# Patient Record
Sex: Female | Born: 1985 | Race: White | Hispanic: No | Marital: Single | Smoking: Never smoker
Health system: Southern US, Community
[De-identification: ages and names within clinical notes are randomized; demographics above are authoritative.]

## PROBLEM LIST (undated history)

## (undated) DIAGNOSIS — D352 Benign neoplasm of pituitary gland: Secondary | ICD-10-CM

## (undated) DIAGNOSIS — G43909 Migraine, unspecified, not intractable, without status migrainosus: Secondary | ICD-10-CM

## (undated) HISTORY — DX: Benign neoplasm of pituitary gland: D35.2

---

## 2020-10-20 ENCOUNTER — Other Ambulatory Visit: Payer: Self-pay

## 2020-10-20 ENCOUNTER — Emergency Department (INDEPENDENT_AMBULATORY_CARE_PROVIDER_SITE_OTHER)
Admission: EM | Admit: 2020-10-20 | Discharge: 2020-10-20 | Disposition: A | Discharge status: Home / Self Care | Payer: BC Managed Care – PPO | Source: Home / Self Care | Attending: Family Medicine | Admitting: Family Medicine

## 2020-10-20 ENCOUNTER — Emergency Department (INDEPENDENT_AMBULATORY_CARE_PROVIDER_SITE_OTHER): Payer: BC Managed Care – PPO

## 2020-10-20 ENCOUNTER — Emergency Department: Admit: 2020-10-20 | Discharge status: Absent without leave | Payer: Self-pay

## 2020-10-20 DIAGNOSIS — J22 Unspecified acute lower respiratory infection: Secondary | ICD-10-CM | POA: Diagnosis not present

## 2020-10-20 DIAGNOSIS — R059 Cough, unspecified: Secondary | ICD-10-CM

## 2020-10-20 DIAGNOSIS — J069 Acute upper respiratory infection, unspecified: Secondary | ICD-10-CM

## 2020-10-20 DIAGNOSIS — R0989 Other specified symptoms and signs involving the circulatory and respiratory systems: Secondary | ICD-10-CM | POA: Diagnosis not present

## 2020-10-20 HISTORY — DX: Migraine, unspecified, not intractable, without status migrainosus: G43.909

## 2020-10-20 HISTORY — DX: Benign neoplasm of pituitary gland: D35.2

## 2020-10-20 LAB — POCT RAPID STREP A (OFFICE): Rapid Strep A Screen: NEGATIVE

## 2020-10-20 MED ORDER — AMOXICILLIN-POT CLAVULANATE 875-125 MG PO TABS: 1.0000 | ORAL_TABLET | Freq: Two times a day (BID) | ORAL | 0 refills | Status: DC

## 2020-10-20 MED ORDER — BENZONATATE 200 MG PO CAPS: 200.0000 mg | ORAL_CAPSULE | Freq: Two times a day (BID) | ORAL | 0 refills | Status: DC | PRN

## 2020-10-20 NOTE — ED Provider Notes (Signed)
[facility]    CSN: [id number] Arrival date & time: 10/20/20  0918      History   Chief Complaint Chief Complaint  Patient presents with  . Sore Throat  . Cough    HPI Suzanne Copeland is a 35 y.o. female.   HPI   Patient's been sick with a respiratory virus for 9 days.  She has had cough sore throat fever runny stuffy nose headache fatigue sweats chills and fever since that time.  She is vaccinated against COVID.  She had a home COVID test that was negative.  She has not been vaccinated against influenza.  Unknown exposure.  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  Rapid strep done today and is negative.  Patient has significant laryngitis.  She states it hurts when she coughs in her chest.  She is coughing up green phlegm and mucus.  No underlying lung disease.  She is never been a smoker.  She has some shortness of breath but no sensation of wheezing.  Past Medical History:  Diagnosis Date  . Migraine   . 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([place])     There are no problems to display for this patient.   Past Surgical History:  Procedure Laterality Date  . CESAREAN SECTION      OB History   No obstetric history on file.      Home Medications    Prior to Admission medications   Medication Sig Start Date End Date Taking? Authorizing Provider  amoxicillin-clavulanate (AUGMENTIN) 875-125 MG tablet Take 1 tablet by mouth every 12 (twelve) hours. 10/20/20  Yes Raylene Everts, MD  benzonatate (TESSALON) 200 MG capsule Take 1 capsule (200 mg total) by mouth 2 (two) times daily as needed for cough. 10/20/20  Yes Raylene Everts, MD  cyclobenzaprine (FLEXERIL) 10 MG tablet Take by mouth. 08/17/20  Yes [provider]  guaiFENesin (ROBITUSSIN) 100 MG/5ML SOLN Take 5 mLs by mouth every 4 (four) hours as needed for cough or to loosen phlegm.   Yes [provider]  pantoprazole (PROTONIX) 40 MG tablet Take by mouth.  07/14/20 07/09/21 Yes [provider]  topiramate (TOPAMAX) 50 MG tablet TAKE 1 TABLET EVERY MORNING AND 2 TABLETS EVERY EVENING FOR 7 DAYS THEN TAKE 2 TABLETS TWICE A DAY 07/07/20  Yes [provider]  traZODone (DESYREL) 50 MG tablet 1 tab p.o. nightly 05/16/20  Yes [provider]    Family History Family History  Problem Relation Age of Onset  . Stroke Mother   . Hypertension Mother   . Diabetes Mother     Social History Social History   Tobacco Use  . Smoking status: Never Smoker  . Smokeless tobacco: Never Used  Vaping Use  . Vaping Use: Never used  Substance Use Topics  . Alcohol use: Not Currently  . Drug use: Not Currently     Allergies   Ciprofloxacin   Review of Systems Review of Systems  See HPI Physical Exam Triage Vital Signs ED Triage Vitals  Enc Vitals Group     BP 10/20/20 0947 102/70     Pulse Rate 10/20/20 0947 (!) 109     Resp 10/20/20 0947 18     Temp 10/20/20 0947 98.2 F (36.8 C)     Temp Source 10/20/20 0947 Oral     SpO2 10/20/20 0947 98 %     Weight 10/20/20 0939 132 lb (59.9 kg)  Height 10/20/20 0939 5\' 1"  (1.549 m)     Head Circumference --      Peak Flow --      Pain Score 10/20/20 0939 10     Pain Loc --      Pain Edu? --      Excl. in [place]? --    No data found.  Updated Vital Signs BP 102/70 (BP Location: Right Arm)   Pulse (!) 109   Temp 98.2 F (36.8 C) (Oral)   Resp 18   Ht 5\' 1"  (1.549 m)   Wt 59.9 kg   LMP 10/13/2020   SpO2 98%   BMI 24.94 kg/m      Physical Exam Constitutional:      General: She is not in acute distress.    Appearance: She is well-developed. She is ill-appearing.     Comments: Appears tired.  Hoarse voice.  HENT:     Head: Normocephalic and atraumatic.     Right Ear: Tympanic membrane and ear canal normal.     Left Ear: Tympanic membrane and ear canal normal.     Nose: Congestion present.     Mouth/Throat:     Mouth: Mucous membranes are moist.     Pharynx:  Posterior oropharyngeal erythema present.  Eyes:     Conjunctiva/sclera: Conjunctivae normal.     Pupils: Pupils are equal, round, and reactive to light.  Cardiovascular:     Rate and Rhythm: Normal rate and regular rhythm.     Heart sounds: Normal heart sounds.  Pulmonary:     Effort: Pulmonary effort is normal. No respiratory distress.     Breath sounds: Normal breath sounds.  Abdominal:     General: There is no distension.     Palpations: Abdomen is soft.  Musculoskeletal:        General: Normal range of motion.     Cervical back: Normal range of motion.  Lymphadenopathy:     Cervical: Cervical adenopathy present.  Skin:    General: Skin is warm and dry.  Neurological:     Mental Status: She is alert.  Psychiatric:        Behavior: Behavior normal.      UC Treatments / Results  Labs (all labs ordered are listed, but only abnormal results are displayed) Labs Reviewed  CULTURE, GROUP A STREP  COVID-19, FLU A+B NAA  COVID-19, FLU A+B AND RSV  POCT RAPID STREP A (OFFICE)  Rapid strep is negative.  Will send culture  EKG   Radiology DG Chest 2 View  Result Date: 10/20/2020 CLINICAL DATA:  Cough and congestion for 9 days EXAM: CHEST - 2 VIEW COMPARISON:  None. FINDINGS: The heart size and mediastinal contours are within normal limits. Both lungs are clear. The visualized skeletal structures are unremarkable. IMPRESSION: No active cardiopulmonary disease. Electronically Signed   By: Monte Fantasia M.D.   On: 10/20/2020 10:58    Procedures Procedures (including critical care time)  Medications Ordered in UC Medications - No data to display  Initial Impression / Assessment and Plan / UC Course  I have reviewed the triage vital signs and the nursing notes.  Pertinent labs & imaging results that were available during my care of the patient were reviewed by me and considered in my medical decision making (see chart for details).     Strep testing negative.  Viral  testing pending.  Likely an upper respiratory virus with persistent symptoms.  After 9 days patient is still significantly  fatigued, feeling ill, and coughing up green sputum.  Will cover with antibiotics.  Continue Tessalon.  Push fluids.  See PCP if not improving in a few days Final Clinical Impressions(s) / UC Diagnoses   Final diagnoses:  Viral upper respiratory tract infection with cough  LRTI (lower respiratory tract infection)     Discharge Instructions     Rest and drink lots of fluids Antibiotic 2 times a day Take a probiotic while you are on antibiotic to prevent stomach upset Take Tessalon 200 mg twice a day May take in addition, if needed, Mucinex DM twice a day Expect improvement over next few days     ED Prescriptions    Medication Sig Dispense Auth. Provider   amoxicillin-clavulanate (AUGMENTIN) 875-125 MG tablet Take 1 tablet by mouth every 12 (twelve) hours. 14 tablet Raylene Everts, MD   benzonatate (TESSALON) 200 MG capsule Take 1 capsule (200 mg total) by mouth 2 (two) times daily as needed for cough. 20 capsule Raylene Everts, MD     PDMP not reviewed this encounter.   Raylene Everts, MD 10/20/20 [phone]

## 2020-10-20 NOTE — Discharge Instructions (Addendum)
Rest and drink lots of fluids Antibiotic 2 times a day Take a probiotic while you are on antibiotic to prevent stomach upset Take Tessalon 200 mg twice a day May take in addition, if needed, Mucinex DM twice a day Expect improvement over next few days

## 2020-10-20 NOTE — ED Triage Notes (Addendum)
Pt presents to Urgent Care with c/o fever, sore throat, cough, and congestion x 9 days. Cannot speak above a whisper. Reports a negative home COVID test yesterday. Has been vaccinated against COVID, not flu.

## 2020-10-21 LAB — CULTURE, GROUP A STREP

## 2020-10-22 LAB — COVID-19, FLU A+B AND RSV
Influenza A, NAA: NOT DETECTED
Influenza B, NAA: NOT DETECTED
RSV, NAA: NOT DETECTED
SARS-CoV-2, NAA: NOT DETECTED

## 2021-04-20 HISTORY — PX: COLONOSCOPY: SHX174

## 2021-04-20 HISTORY — PX: UPPER GI ENDOSCOPY: SHX6162

## 2021-04-21 ENCOUNTER — Ambulatory Visit
Admission: RE | Admit: 2021-04-21 | Discharge: 2021-04-21 | Disposition: A | Discharge status: Home / Self Care | Payer: Managed Care, Other (non HMO) | Source: Ambulatory Visit | Attending: Nurse Practitioner | Admitting: Nurse Practitioner

## 2021-04-21 ENCOUNTER — Other Ambulatory Visit: Payer: Self-pay

## 2021-04-21 ENCOUNTER — Other Ambulatory Visit: Payer: Self-pay | Admitting: Nurse Practitioner

## 2021-04-21 DIAGNOSIS — R1084 Generalized abdominal pain: Secondary | ICD-10-CM | POA: Insufficient documentation

## 2021-04-21 MED ORDER — IOHEXOL 300 MG/ML  SOLN
100.0000 mL | Freq: Once | INTRAMUSCULAR | Status: AC | PRN
  Administered 2021-04-21: 100 mL via INTRAVENOUS

## 2021-04-28 ENCOUNTER — Other Ambulatory Visit: Payer: Self-pay

## 2021-04-28 ENCOUNTER — Ambulatory Visit (INDEPENDENT_AMBULATORY_CARE_PROVIDER_SITE_OTHER): Payer: Managed Care, Other (non HMO) | Admitting: Urology

## 2021-04-28 ENCOUNTER — Encounter: Payer: Self-pay | Admitting: Urology

## 2021-04-28 VITALS — BP 103/70 | HR 101 | Ht 61.0 in | Wt 126.0 lb

## 2021-04-28 DIAGNOSIS — R109 Unspecified abdominal pain: Secondary | ICD-10-CM | POA: Diagnosis not present

## 2021-04-28 DIAGNOSIS — N3281 Overactive bladder: Secondary | ICD-10-CM

## 2021-04-28 NOTE — Progress Notes (Signed)
   04/28/21 12:55 PM   Luetta Nutting Olen Pel [birth year] [phone]  CC: Abdominal pain, left renal cysts, bladder symptoms  HPI: I saw Ms. Suzanne Copeland today for evaluation of the above issues.  She is a 35 year old female with a history of at least a few years of chronic midline mid and upper abdominal pain.  This seems to be worse with eating, and associated with meals.  She also has significant constipation with a bowel movement only 1-2 times per week.  She is undergoing work-up with GI.  She recently had a CT that showed possible left mild hydronephrosis versus parapelvic cyst.  She had multiple imaging tests with renal ultrasound x2 and CT with contrast in [place] within the last 2 years that confirmed these are parapelvic cysts and not hydronephrosis.  There are no stones or hydroureter.  She denies any Dietl's crisis or left-sided pain with fluid intake.  Renal function is normal, and urinalysis was benign.  She also reports bladder symptoms of urgency and decreased sensation to urinate.  She drinks primarily Dr. Malachi Bonds during the day.  She had an episode of incontinence where she did not make it in time.  She does not like to take any medications unless absolutely necessary.  She denies any gross hematuria or dysuria.     PMH: Past Medical History:  Diagnosis Date   Migraine    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([place])     Surgical History: Past Surgical History:  Procedure Laterality Date   CESAREAN SECTION        Family History: Family History  Problem Relation Age of Onset   Stroke Mother    Hypertension Mother    Diabetes Mother     Social History:  reports that she has never smoked. She has never used smokeless tobacco. She reports that she does not currently use alcohol. She reports that she does not currently use drugs.  Physical Exam: BP 103/70   Pulse (!) 101   Ht 5\' 1"  (1.549 m)   Wt 126 lb (57.2 kg)   BMI 23.81 kg/m    Constitutional:  Alert and oriented, No acute  distress. Cardiovascular: No clubbing, cyanosis, or edema. Respiratory: Normal respiratory effort, no increased work of breathing. GI: Abdomen is soft, nontender, nondistended, no abdominal masses   Laboratory Data: Reviewed, see HPI  Pertinent Imaging: I have personally viewed and interpreted the recent CT showing multiple left parapelvic cyst but no definitive hydronephrosis based on comparison to prior imaging.  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  I did not recommend any further evaluation or investigation regarding her left parapelvic cyst, as he is been stable since 2020, and she does not have any symptoms clinically that correlate with an underlying UPJ obstruction that would warrant further imaging with a CT urogram or renal scan.  We reviewed behavioral strategies including timed voiding and avoiding bladder irritants like soda regarding her urinary urgency and overactive symptoms.  She would not be a good candidate for anticholinergics with her baseline constipation.  Follow-up with urology as needed  Nickolas Madrid, MD 04/28/2021  [facility] [address], [place],  [address] [phone]

## 2021-04-28 NOTE — Patient Instructions (Signed)

## 2021-04-30 ENCOUNTER — Other Ambulatory Visit: Payer: Self-pay | Admitting: Nurse Practitioner

## 2021-04-30 DIAGNOSIS — Z8719 Personal history of other diseases of the digestive system: Secondary | ICD-10-CM

## 2021-04-30 DIAGNOSIS — K5909 Other constipation: Secondary | ICD-10-CM

## 2021-05-05 ENCOUNTER — Other Ambulatory Visit: Payer: Self-pay

## 2021-05-05 ENCOUNTER — Ambulatory Visit
Admission: RE | Admit: 2021-05-05 | Discharge: 2021-05-05 | Disposition: A | Discharge status: Home / Self Care | Payer: Managed Care, Other (non HMO) | Source: Ambulatory Visit | Attending: Nurse Practitioner | Admitting: Nurse Practitioner

## 2021-05-05 DIAGNOSIS — K5909 Other constipation: Secondary | ICD-10-CM | POA: Insufficient documentation

## 2021-05-05 DIAGNOSIS — Z8719 Personal history of other diseases of the digestive system: Secondary | ICD-10-CM | POA: Diagnosis present

## 2021-05-07 ENCOUNTER — Other Ambulatory Visit: Payer: Self-pay | Admitting: Nurse Practitioner

## 2021-05-07 DIAGNOSIS — K824 Cholesterolosis of gallbladder: Secondary | ICD-10-CM

## 2021-05-10 ENCOUNTER — Other Ambulatory Visit: Payer: Self-pay | Admitting: Internal Medicine

## 2021-05-12 ENCOUNTER — Ambulatory Visit: Payer: Managed Care, Other (non HMO) | Admitting: Urology

## 2021-05-12 LAB — SURGICAL PATHOLOGY

## 2021-05-21 ENCOUNTER — Other Ambulatory Visit: Payer: Self-pay | Admitting: General Surgery

## 2021-05-21 NOTE — Progress Notes (Signed)
Subjective:     Patient ID: Suzanne Copeland is a 35 y.o. female.   HPI   The following portions of the patient's history were reviewed and updated as appropriate.   This a new patient is here today for: office visit. Here for evaluation of gall bladder polyp referred by Dawson Bills NP.     She does admit to chronic right upper quadrant abdominal pain. Patient rates the pain as a 9 in the upper right abdomin and the pain travels to her shoulders. Patients reports she feels nausea most of the time.  She had initially been placed on Protonix and then double dose without significant improvement.   The patient reports that she was at her baseline until 2019 when she was placed on amitriptyline to help manage headaches.  Shortly after that she began to have profound weight gain up to 185 pounds.  When the amitriptyline was stopped in 2020 and she lost weight back to her baseline she began to have abdominal pain.  In 2020 while living in [place] she reports an evaluation by CT describing a 2 mm gallbladder nodule.   The patient describes the right upper quadrant pain after meals as a gnawing, twisting pain that may last for hours.  It does seem to radiate up into the anterior shoulder.   Protonix was initially prescribed in 2020 and then she was placed on a twice daily schedule without significant improvement.  She reports that she has irregular bowel movements, but she does take Miralax to help her bowel movements.    Patient reports she feels bloated and has nausea after each meal.  She has tried to modify her diet, and now is limited to a pescatarian diet even then with postprandial abdominal pain.   Her symptoms have been since 2020 while in [place], she did have a CT scan there as well.   The patient was born in [place], moved to [place] at age 53, back to [place] at age 13 and now in [place] for about 14 months.   She reports a significant family history of gallbladder  disease with multiple family members having "polyps."  She reports her paternal grandfather died of gallbladder complications in 8182.     Review of Systems  Constitutional: Negative for chills and fever.  Respiratory: Negative for cough.          Chief Complaint  Patient presents with   New Patient      BP 104/76   Pulse 93   Temp 36.4 C (97.6 F)   Ht 154.9 cm ('5\' 1"' )   Wt 57.4 kg (126 lb 9.6 oz)   LMP 05/02/2021   SpO2 100%   BMI 23.92 kg/m        Past Medical History:  Diagnosis Date   Chronic abdominal pain, unspecified     Chronic gastritis     Constipation     Migraine headache     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([facility])             Past Surgical History:  Procedure Laterality Date   CESAREAN SECTION N/A     CESAREAN SECTION N/A     exploratory gyn surgery N/A     LAPAROSCOPIC TUBAL LIGATION                    OB History     Gravida  3   Para  2   Term  Preterm      AB      Living         SAB      IAB      Ectopic      Molar      Multiple      Live Births           Obstetric Comments  Age at first period 35 Age of first pregnancy 2             Social History        Socioeconomic History   Marital status: Single  Tobacco Use   Smoking status: Never   Smokeless tobacco: Never  Substance and Sexual Activity   Alcohol use: Yes   Drug use: Never          Allergies  Allergen Reactions   Ciprofloxacin Rash and Hives            Current Outpatient Medications  Medication Sig Dispense Refill   cyclobenzaprine (FLEXERIL) 10 MG tablet Take 1 tablet (10 mg total) by mouth 2 (two) times daily 60 tablet 2   linaCLOtide (LINZESS) 145 mcg capsule Take 1 capsule (145 mcg total) by mouth once daily for 30 days Take 30 mins before food 30 capsule 0   pantoprazole (PROTONIX) 40 MG DR tablet Take 1 tablet (40 mg total) by mouth 2 (two) times daily before meals 60 tablet 6   topiramate (TOPAMAX) 100 MG tablet Take 1 tablet (100 mg total)  by mouth 2 (two) times daily 60 tablet 2   traZODone (DESYREL) 50 MG tablet Take 1 tablet (50 mg total) by mouth at bedtime 30 tablet 2   ubrogepant 50 mg Tab Take 60m at headache onset. Can repeat after 2 hours if needed. Do not exceed 2037min 24 hours. 20 tablet 1   eptinezumab-jjmr (VYEPTI) 100 mg/mL intravenous solution Inject 1 mL (100 mg total) into the vein every 3 (three) months (Patient not taking: Reported on 04/29/2021) 1 mL 3   ubrogepant (UBRELVY ORAL) Take by mouth       venlafaxine (EFFEXOR) 37.5 MG tablet Start taking Effexor 37.5 mg daily for one week, then increase to 37.5 mg twice per day for headaches. (Patient not taking: Reported on 05/20/2021) 60 tablet 3    No current facility-administered medications for this visit.           Family History  Problem Relation Age of Onset   Diabetes Mother     High blood pressure (Hypertension) Mother     Stroke Mother     Breast cancer Maternal Aunt     Gallbladder disease Maternal Aunt     Breast cancer Maternal Grandmother     Breast cancer Paternal Grandmother     Colon polyps Paternal Grandfather     Colon cancer Neg Hx          Labs and Radiology:    Upper and lower endoscopy studies of May 10, 2021 were reviewed and discussed with the treating physician, KeMadolyn FriezeD.  Mild chronic gastritis.  No evidence of H. pylori.  No evidence of colitis or adenomatous polyps.   CT of the abdomen and pelvis of April 21, 2021:   IMPRESSION: 1.  Prominent stool throughout the colon favors constipation. 2. Small fat density along the posterior wall of the gallbladder could be a small gallstone or a small benign polyp. 3. Mild fluid density prominence along the left collecting system, possibly from parapelvic cysts,  although I cannot exclude mild caliectasis. No stone is identified and there is no hydroureter.   Abdominal ultrasound dated May 05, 2021:   IMPRESSION: Multiple gallbladder polyps measuring up to 6  mm. Given their size, follow-up sonography is recommended in 6-12 months to document stability.   These imaging studies were independently reviewed.   Laboratory review March 17, 2021: Component Ref Range & Units 2 mo ago   White Blood Cell Count - [facility] 3.4 - 10.8 x10E3/uL 4.9   Red Blood Cell Count - [facility] 3.77 - 5.28 x10E6/uL 4.26   Hemoglobin - [facility] 11.1 - 15.9 g/dL 11.8   Hematocrit - [facility] 34.0 - 46.6 % 36.7   MCV - [facility] 79 - 97 fL 86   MCH  - [facility] 26.6 - 33.0 pg 27.7   MCHC - [facility] 31.5 - 35.7 g/dL 32.2   RDW - [facility] 11.7 - 15.4 % 12.5   Platelet Count - [facility] 150 - 450 x10E3/uL 335   Neutrophils - [facility] Not Estab. % 54   LYMPHS -  [facility] Not Estab. % 34   Monocytes - [facility] Not Estab. % 9   Eos - [facility] Not Estab. % 2   Basos - [facility] Not Estab. % 1   Neutrophils (Absolute) - [facility] 1.4 - 7.0 x10E3/uL 2.6   Lymphs (Absolute) - [facility] 0.7 - 3.1 x10E3/uL 1.7   Monocytes(Absolute) - [facility] 0.1 - 0.9 x10E3/uL 0.4   Eos (Absolute) - [facility] 0.0 - 0.4 x10E3/uL 0.1   Baso (Absolute) - [facility] 0.0 - 0.2 x10E3/uL 0.1   Immature Granulocytes - [facility] Not Estab. % 0   Immature Grans (Abs) - [facility] 0.0 - 0.1 x10E3/uL 0.0       Glucose Random - [facility] 70 - 99 mg/dL 84   Comment:               **Please note reference interval change**  Blood Urea Nitrogen - [facility] 6 - 20 mg/dL 17   Creatinine  - [facility] 0.57 - 1.00 mg/dL 0.85   EGFR (CKD-EPI 2021) - [facility] >59 mL/min/1.73 90   Bun/Creatinine Ratio - [facility] 9 - 23 20   Sodium - [facility] 134 - 144 mmol/L 146 High    Potassium - [facility] 3.5 - 5.2 mmol/L 4.6   Chloride - [facility] 96 - 106 mmol/L 111 High    Carbon Dioxide - [facility] 20 - 29 mmol/L 19 Low    Calcium - [facility] 8.7 - 10.2 mg/dL 9.1   Protein Total - [facility] 6.0 - 8.5 g/dL 6.9   Albumin - [facility] 3.8 - 4.8 g/dL 4.3   Globulin, Total - [facility] 1.5 - 4.5 g/dL 2.6   A/G Ratio - [facility] 1.2 - 2.2 1.7   Bilirubin Total - [facility] 0.0 -  1.2 mg/dL <0.2   Alkaline Phosphatase - [facility] 44 - 121 IU/L 66   Ast - [facility] 0 - 40 IU/L 17   Alanine Aminotransferase - [facility] 0 - 32 IU/L 11     Hemoglobin A1c - [facility] 4.8 - 5.6 % 5.4            Objective:   Physical Exam Exam conducted with a chaperone present.  Constitutional:      Appearance: Normal appearance.  Cardiovascular:     Rate and Rhythm: Normal rate and regular rhythm.     Pulses: Normal pulses.     Heart sounds: Normal heart sounds.  Pulmonary:     Effort: Pulmonary effort is normal.  Breath sounds: Normal breath sounds.  Abdominal:     General: Bowel sounds are normal.     Palpations: Abdomen is soft.     Tenderness: There is abdominal tenderness in the right upper quadrant.     Hernia: No hernia is present.  Musculoskeletal:     Cervical back: Neck supple.  Skin:    General: Skin is warm and dry.  Neurological:     Mental Status: She is alert and oriented to person, place, and time.  Psychiatric:        Mood and Affect: Mood normal.        Behavior: Behavior normal.           Assessment:     Multiple gallbladder polyps greater than 5 mm.   Postprandial abdominal pain with negative EGD/colonoscopy.   Weight loss   Plan:     The pros and cons of elective cholecystectomy for her present findings were reviewed in detail.  No guarantee of relief of symptoms can be made.   The case was reviewed with Dr. Alice Reichert from GI.   As no other source for her pain is identified, and by report the polyps have enlarged over the last 2 years (2020 CT not available for review) it seems reasonable to offer cholecystectomy.   Detailed review of the risks associated with the procedure including anesthesia, injury to adjacent organs and biliary tract injury were reviewed.  Possibility of an open procedure was discussed.   At this time she is looking at any option for her pain management.  Again, additional time was spent with the patient to confirm that  she is aware that I guarantee cannot be made regarding relief of symptoms.      This note is partially prepared by Karie Fetch, RN, acting as a scribe in the presence of Dr. Hervey Ard, MD.  The documentation recorded by the scribe accurately reflects the service I personally performed and the decisions made by me.    Robert Bellow, MD FACS

## 2021-05-26 ENCOUNTER — Other Ambulatory Visit
Admission: RE | Admit: 2021-05-26 | Discharge: 2021-05-26 | Disposition: A | Discharge status: Home / Self Care | Payer: Managed Care, Other (non HMO) | Source: Ambulatory Visit | Attending: General Surgery | Admitting: General Surgery

## 2021-05-26 ENCOUNTER — Other Ambulatory Visit: Payer: Self-pay

## 2021-05-26 NOTE — Patient Instructions (Signed)
Your procedure is scheduled on: Friday May 28, 2021. Report to Day Surgery inside [facility] 2nd floor, stop by admission desk before getting on elevator. To find out your arrival time please call [phone] between 1PM - 3PM on Thursday May 27, 2021.  Remember: Instructions that are not followed completely may result in serious medical risk,  up to and including death, or upon the discretion of your surgeon and anesthesiologist your  surgery may need to be rescheduled.     _X__ 1. Do not eat food after midnight the night before your procedure.                 No chewing gum or hard candies. You may drink clear liquids up to 2 hours                 before you are scheduled to arrive for your surgery- DO not drink clear                 liquids within 2 hours of the start of your surgery.                 Clear Liquids include:  water, apple juice without pulp, clear Gatorade, G2 or                  Gatorade Zero (avoid Red/Purple/Blue), Black Coffee or Tea (Do not add                 anything to coffee or tea).  __X__2.  On the morning of surgery brush your teeth with toothpaste and water, you                may rinse your mouth with mouthwash if you wish.  Do not swallow any toothpaste or mouthwash.     _X__ 3.  No Alcohol for 24 hours before or after surgery.   _X__ 4.  Do Not Smoke or use e-cigarettes For 24 Hours Prior to Your Surgery.                 Do not use any chewable tobacco products for at least 6 hours prior to                 Surgery.  _X__  5.  Do not use any recreational drugs (marijuana, cocaine, heroin, ecstasy, MDMA or other)                For at least one week prior to your surgery.  Combination of these drugs with anesthesia                May have life threatening results.  __X__ 6.  Notify your doctor if there is any change in your medical condition      (cold, fever, infections).     Do not wear jewelry, make-up, hairpins,  clips or nail polish. Do not wear lotions, powders, or perfumes. You may wear deodorant. Do not shave 48 hours prior to surgery. Men may shave face and neck. Do not bring valuables to the hospital.    [facility] is not responsible for any belongings or valuables.  Contacts, dentures or bridgework may not be worn into surgery. Leave your suitcase in the car. After surgery it may be brought to your room. For patients admitted to the hospital, discharge time is determined by your treatment team.   Patients discharged the day of surgery will not be allowed to drive home.  Make arrangements for someone to be with you for the first 24 hours of your Same Day Discharge.   __X__ Take these medicines the morning of surgery with A SIP OF WATER:    1. topiramate (TOPAMAX) 100 MG  2.   3.   4.  5.  6.  ____ Fleet Enema (as directed)   __X__ Use Antibacterial Soap (or wipes) as directed  ____ Use Benzoyl Peroxide Gel as instructed  ____ Use inhalers on the day of surgery  ____ Stop metformin 2 days prior to surgery    ____ Take 1/2 of usual insulin dose the night before surgery. No insulin the morning          of surgery.   ____ Call your PCP, cardiologist, or Pulmonologist if taking Coumadin/Plavix/aspirin and ask when to stop before your surgery.   __X__ One Week prior to surgery- Stop Anti-inflammatories such as Ibuprofen, Aleve, Advil, Motrin, meloxicam (MOBIC), diclofenac, etodolac, ketorolac, Toradol, Daypro, piroxicam, Goody's or BC powders. OK TO USE TYLENOL IF NEEDED   __X__ Stop supplements until after surgery.    ____ Bring C-Pap to the hospital.    If you have any questions regarding your pre-procedure instructions,  Please call Pre-admit Testing at [phone].

## 2021-05-27 MED ORDER — ORAL CARE MOUTH RINSE: 15.0000 mL | Freq: Once | OROMUCOSAL | Status: AC

## 2021-05-27 MED ORDER — CHLORHEXIDINE GLUCONATE 0.12 % MT SOLN: 15.0000 mL | Freq: Once | OROMUCOSAL | Status: AC

## 2021-05-27 MED ORDER — FAMOTIDINE 20 MG PO TABS: 20.0000 mg | ORAL_TABLET | Freq: Once | ORAL | Status: AC

## 2021-05-27 MED ORDER — CHLORHEXIDINE GLUCONATE CLOTH 2 % EX PADS: 6.0000 | MEDICATED_PAD | Freq: Once | CUTANEOUS | Status: DC

## 2021-05-27 MED ORDER — LACTATED RINGERS IV SOLN: INTRAVENOUS | Status: DC

## 2021-05-28 ENCOUNTER — Encounter
Admission: RE | Disposition: A | Discharge status: Home / Self Care | Payer: Self-pay | Source: Home / Self Care | Attending: General Surgery

## 2021-05-28 ENCOUNTER — Other Ambulatory Visit: Payer: Self-pay

## 2021-05-28 ENCOUNTER — Ambulatory Visit: Payer: Managed Care, Other (non HMO) | Admitting: Certified Registered Nurse Anesthetist

## 2021-05-28 ENCOUNTER — Ambulatory Visit: Payer: Managed Care, Other (non HMO)

## 2021-05-28 ENCOUNTER — Encounter: Payer: Self-pay | Admitting: General Surgery

## 2021-05-28 ENCOUNTER — Ambulatory Visit
Admission: RE | Admit: 2021-05-28 | Discharge: 2021-05-28 | Disposition: A | Discharge status: Home / Self Care | Payer: Managed Care, Other (non HMO) | Attending: General Surgery | Admitting: General Surgery

## 2021-05-28 DIAGNOSIS — K811 Chronic cholecystitis: Secondary | ICD-10-CM | POA: Diagnosis not present

## 2021-05-28 DIAGNOSIS — Z419 Encounter for procedure for purposes other than remedying health state, unspecified: Secondary | ICD-10-CM

## 2021-05-28 DIAGNOSIS — K824 Cholesterolosis of gallbladder: Secondary | ICD-10-CM | POA: Diagnosis present

## 2021-05-28 HISTORY — PX: CHOLECYSTECTOMY: SHX55

## 2021-05-28 LAB — POCT PREGNANCY, URINE: Preg Test, Ur: NEGATIVE

## 2021-05-28 SURGERY — LAPAROSCOPIC CHOLECYSTECTOMY WITH INTRAOPERATIVE CHOLANGIOGRAM
Anesthesia: General | Site: Abdomen

## 2021-05-28 MED ORDER — ONDANSETRON HCL 4 MG/2ML IJ SOLN
INTRAMUSCULAR | Status: AC
  Filled 2021-05-28: qty 2

## 2021-05-28 MED ORDER — OXYCODONE HCL 5 MG PO TABS: 5.0000 mg | ORAL_TABLET | ORAL | Status: DC | PRN

## 2021-05-28 MED ORDER — LIDOCAINE HCL (PF) 2 % IJ SOLN
INTRAMUSCULAR | Status: AC
  Filled 2021-05-28: qty 5

## 2021-05-28 MED ORDER — BUPIVACAINE-EPINEPHRINE (PF) 0.5% -1:200000 IJ SOLN
INTRAMUSCULAR | Status: AC
  Filled 2021-05-28: qty 30

## 2021-05-28 MED ORDER — FENTANYL CITRATE (PF) 100 MCG/2ML IJ SOLN
25.0000 ug | INTRAMUSCULAR | Status: DC | PRN
  Administered 2021-05-28: 25 ug via INTRAVENOUS
  Administered 2021-05-28: 50 ug via INTRAVENOUS

## 2021-05-28 MED ORDER — SUGAMMADEX SODIUM 200 MG/2ML IV SOLN
INTRAVENOUS | Status: DC | PRN
  Administered 2021-05-28: 114.4 mg via INTRAVENOUS

## 2021-05-28 MED ORDER — PROPOFOL 10 MG/ML IV BOLUS
INTRAVENOUS | Status: DC | PRN
  Administered 2021-05-28: 130 mg via INTRAVENOUS

## 2021-05-28 MED ORDER — HYDROCODONE-ACETAMINOPHEN 5-325 MG PO TABS
ORAL_TABLET | ORAL | Status: AC
  Filled 2021-05-28: qty 1

## 2021-05-28 MED ORDER — HEMOSTATIC AGENTS (NO CHARGE) OPTIME
TOPICAL | Status: DC | PRN
  Administered 2021-05-28: 1 via TOPICAL

## 2021-05-28 MED ORDER — FENTANYL CITRATE (PF) 100 MCG/2ML IJ SOLN
INTRAMUSCULAR | Status: DC | PRN
  Administered 2021-05-28 (×2): 50 ug via INTRAVENOUS

## 2021-05-28 MED ORDER — OXYCODONE HCL 5 MG PO TABS
ORAL_TABLET | ORAL | Status: AC
  Administered 2021-05-28: 5 mg via ORAL
  Filled 2021-05-28: qty 1

## 2021-05-28 MED ORDER — HYDROCODONE-ACETAMINOPHEN 5-325 MG PO TABS: 1.0000 | ORAL_TABLET | ORAL | Status: DC | PRN

## 2021-05-28 MED ORDER — FENTANYL CITRATE (PF) 100 MCG/2ML IJ SOLN
INTRAMUSCULAR | Status: AC
  Administered 2021-05-28: 25 ug via INTRAVENOUS
  Filled 2021-05-28: qty 2

## 2021-05-28 MED ORDER — KETOROLAC TROMETHAMINE 30 MG/ML IJ SOLN
INTRAMUSCULAR | Status: AC
  Filled 2021-05-28: qty 1

## 2021-05-28 MED ORDER — SODIUM CHLORIDE 0.9 % IV SOLN
INTRAVENOUS | Status: DC | PRN
  Administered 2021-05-28: 20 mL

## 2021-05-28 MED ORDER — HYDROCODONE-ACETAMINOPHEN 5-325 MG PO TABS: 1.0000 | ORAL_TABLET | ORAL | 0 refills | Status: DC | PRN

## 2021-05-28 MED ORDER — ONDANSETRON HCL 4 MG/2ML IJ SOLN
INTRAMUSCULAR | Status: DC | PRN
  Administered 2021-05-28: 4 mg via INTRAVENOUS

## 2021-05-28 MED ORDER — DEXMEDETOMIDINE (PRECEDEX) IN NS 20 MCG/5ML (4 MCG/ML) IV SYRINGE
PREFILLED_SYRINGE | INTRAVENOUS | Status: DC | PRN
  Administered 2021-05-28: 8 ug via INTRAVENOUS
  Administered 2021-05-28: 12 ug via INTRAVENOUS
  Administered 2021-05-28: 8 ug via INTRAVENOUS
  Administered 2021-05-28: 4 ug via INTRAVENOUS

## 2021-05-28 MED ORDER — PROMETHAZINE HCL 25 MG/ML IJ SOLN
INTRAMUSCULAR | Status: AC
  Administered 2021-05-28: 6.25 mg via INTRAVENOUS
  Filled 2021-05-28: qty 1

## 2021-05-28 MED ORDER — MIDAZOLAM HCL 2 MG/2ML IJ SOLN
INTRAMUSCULAR | Status: DC | PRN
  Administered 2021-05-28: 2 mg via INTRAVENOUS

## 2021-05-28 MED ORDER — CHLORHEXIDINE GLUCONATE 0.12 % MT SOLN
OROMUCOSAL | Status: AC
  Administered 2021-05-28: 15 mL via OROMUCOSAL
  Filled 2021-05-28: qty 15

## 2021-05-28 MED ORDER — HYDROMORPHONE HCL 1 MG/ML IJ SOLN
INTRAMUSCULAR | Status: AC
  Filled 2021-05-28: qty 1

## 2021-05-28 MED ORDER — FAMOTIDINE 20 MG PO TABS
ORAL_TABLET | ORAL | Status: AC
  Administered 2021-05-28: 20 mg via ORAL
  Filled 2021-05-28: qty 1

## 2021-05-28 MED ORDER — LIDOCAINE HCL (CARDIAC) PF 100 MG/5ML IV SOSY
PREFILLED_SYRINGE | INTRAVENOUS | Status: DC | PRN
  Administered 2021-05-28: 80 mg via INTRAVENOUS

## 2021-05-28 MED ORDER — ACETAMINOPHEN 10 MG/ML IV SOLN
INTRAVENOUS | Status: AC
  Filled 2021-05-28: qty 100

## 2021-05-28 MED ORDER — 0.9 % SODIUM CHLORIDE (POUR BTL) OPTIME
TOPICAL | Status: DC | PRN
  Administered 2021-05-28: 500 mL

## 2021-05-28 MED ORDER — PROPOFOL 10 MG/ML IV BOLUS
INTRAVENOUS | Status: AC
  Filled 2021-05-28: qty 20

## 2021-05-28 MED ORDER — DEXAMETHASONE SODIUM PHOSPHATE 10 MG/ML IJ SOLN
INTRAMUSCULAR | Status: AC
  Filled 2021-05-28: qty 1

## 2021-05-28 MED ORDER — MIDAZOLAM HCL 2 MG/2ML IJ SOLN
INTRAMUSCULAR | Status: AC
  Filled 2021-05-28: qty 2

## 2021-05-28 MED ORDER — ROCURONIUM BROMIDE 100 MG/10ML IV SOLN
INTRAVENOUS | Status: DC | PRN
  Administered 2021-05-28: 40 mg via INTRAVENOUS

## 2021-05-28 MED ORDER — PHENYLEPHRINE 40 MCG/ML (10ML) SYRINGE FOR IV PUSH (FOR BLOOD PRESSURE SUPPORT)
PREFILLED_SYRINGE | INTRAVENOUS | Status: DC | PRN
  Administered 2021-05-28 (×2): 80 ug via INTRAVENOUS

## 2021-05-28 MED ORDER — SUCCINYLCHOLINE CHLORIDE 200 MG/10ML IV SOSY
PREFILLED_SYRINGE | INTRAVENOUS | Status: AC
  Filled 2021-05-28: qty 10

## 2021-05-28 MED ORDER — HYDROMORPHONE HCL 1 MG/ML IJ SOLN
INTRAMUSCULAR | Status: DC | PRN
  Administered 2021-05-28 (×2): .5 mg via INTRAVENOUS

## 2021-05-28 MED ORDER — FENTANYL CITRATE (PF) 100 MCG/2ML IJ SOLN
INTRAMUSCULAR | Status: AC
  Filled 2021-05-28: qty 2

## 2021-05-28 MED ORDER — DEXAMETHASONE SODIUM PHOSPHATE 10 MG/ML IJ SOLN
INTRAMUSCULAR | Status: DC | PRN
  Administered 2021-05-28: 10 mg via INTRAVENOUS

## 2021-05-28 MED ORDER — LACTATED RINGERS IR SOLN
Status: DC | PRN
  Administered 2021-05-28: 1000 mL

## 2021-05-28 MED ORDER — ACETAMINOPHEN 10 MG/ML IV SOLN
INTRAVENOUS | Status: DC | PRN
  Administered 2021-05-28: 1000 mg via INTRAVENOUS

## 2021-05-28 MED ORDER — SODIUM CHLORIDE (PF) 0.9 % IJ SOLN
INTRAMUSCULAR | Status: AC
  Filled 2021-05-28: qty 50

## 2021-05-28 MED ORDER — SODIUM CHLORIDE 0.9 % IR SOLN
Status: DC | PRN
  Administered 2021-05-28: 1000 mL

## 2021-05-28 MED ORDER — PROMETHAZINE HCL 25 MG/ML IJ SOLN: 6.2500 mg | INTRAMUSCULAR | Status: DC | PRN

## 2021-05-28 MED ORDER — BUPIVACAINE-EPINEPHRINE 0.5% -1:200000 IJ SOLN
INTRAMUSCULAR | Status: DC | PRN
  Administered 2021-05-28: 20 mL

## 2021-05-28 MED ORDER — ROCURONIUM BROMIDE 10 MG/ML (PF) SYRINGE
PREFILLED_SYRINGE | INTRAVENOUS | Status: AC
  Filled 2021-05-28: qty 10

## 2021-05-28 SURGICAL SUPPLY — 41 items
APL PRP STRL LF DISP 70% ISPRP (MISCELLANEOUS) ×1
APPLIER CLIP ROT 10 11.4 M/L (STAPLE) ×2
APR CLP MED LRG 11.4X10 (STAPLE) ×1
BAG SPEC RTRVL LRG 6X4 10 (ENDOMECHANICALS) ×2
BLADE SURG 11 STRL SS SAFETY (MISCELLANEOUS) ×2 IMPLANT
CANNULA DILATOR 10 W/SLV (CANNULA) ×2 IMPLANT
CANNULA DILATOR 5 W/SLV (CANNULA) ×4 IMPLANT
CATH CHOLANG 76X19 KUMAR (CATHETERS) ×2 IMPLANT
CHLORAPREP W/TINT 26 (MISCELLANEOUS) ×2 IMPLANT
CLIP APPLIE ROT 10 11.4 M/L (STAPLE) ×1 IMPLANT
DRAPE C-ARM 42X70 (DRAPES) ×2 IMPLANT
DRSG TEGADERM 2-3/8X2-3/4 SM (GAUZE/BANDAGES/DRESSINGS) ×8 IMPLANT
DRSG TELFA 4X3 1S NADH ST (GAUZE/BANDAGES/DRESSINGS) ×2 IMPLANT
ELECT REM PT RETURN 9FT ADLT (ELECTROSURGICAL) ×2
ELECTRODE REM PT RTRN 9FT ADLT (ELECTROSURGICAL) ×1 IMPLANT
GAUZE 4X4 16PLY ~~LOC~~+RFID DBL (SPONGE) ×2 IMPLANT
GLOVE SURG ENC MOIS LTX SZ7.5 (GLOVE) ×2 IMPLANT
GLOVE SURG UNDER LTX SZ8 (GLOVE) ×2 IMPLANT
GOWN STRL REUS W/ TWL LRG LVL3 (GOWN DISPOSABLE) ×3 IMPLANT
GOWN STRL REUS W/TWL LRG LVL3 (GOWN DISPOSABLE) ×6
HEMOSTAT SURGICEL 2X3 (HEMOSTASIS) ×2 IMPLANT
IRRIG SUCT STRYKERFLOW 2 WTIP (MISCELLANEOUS) ×2
IRRIGATION SUCT STRKRFLW 2 WTP (MISCELLANEOUS) ×1 IMPLANT
IV LACTATED RINGERS 1000ML (IV SOLUTION) ×2 IMPLANT
KIT TURNOVER KIT A (KITS) ×2 IMPLANT
LABEL OR SOLS (LABEL) ×2 IMPLANT
MANIFOLD NEPTUNE II (INSTRUMENTS) ×2 IMPLANT
NDL INSUFF ACCESS 14 VERSASTEP (NEEDLE) ×2 IMPLANT
NS IRRIG 500ML POUR BTL (IV SOLUTION) ×2 IMPLANT
PACK LAP CHOLECYSTECTOMY (MISCELLANEOUS) ×2 IMPLANT
POUCH SPECIMEN RETRIEVAL 10MM (ENDOMECHANICALS) ×4 IMPLANT
SCISSORS METZENBAUM CVD 33 (INSTRUMENTS) ×2 IMPLANT
SET TUBE SMOKE EVAC HIGH FLOW (TUBING) ×2 IMPLANT
SPONGE KITTNER 5P (MISCELLANEOUS) IMPLANT
STRIP CLOSURE SKIN 1/2X4 (GAUZE/BANDAGES/DRESSINGS) ×2 IMPLANT
SUT VIC AB 0 CT2 27 (SUTURE) IMPLANT
SUT VIC AB 4-0 FS2 27 (SUTURE) ×2 IMPLANT
SWABSTK COMLB BENZOIN TINCTURE (MISCELLANEOUS) ×2 IMPLANT
TROCAR XCEL NON-BLD 11X100MML (ENDOMECHANICALS) ×2 IMPLANT
WATER STERILE IRR 1000ML POUR (IV SOLUTION) ×2 IMPLANT
WATER STERILE IRR 500ML POUR (IV SOLUTION) ×2 IMPLANT

## 2021-05-28 NOTE — Op Note (Signed)
Preoperative diagnosis: Suspected biliary colic, gallbladder polyps.  Postoperative diagnosis: Same.  Operative procedure: Laparoscopic cholecystectomy with intraoperative cholangiograms.  Operating Surgeon: Hervey Ard, MD.  Anesthesia: General endotracheal, Marcaine 0.5% with 1: 200,000 units of epinephrine, 20 cc local infiltration.  The right lung 50 cc.  Clinical note: This 35 year old who said 2-year history of abdominal pain, primarily postprandial.  Upper and lower endoscopy was unremarkable.  CT scan was negative except for the identification of enlarging and multiple gallbladder polyps.  She was apprised that her clinical history was "soft" for biliary colic, but has no other source for her pain and with her ongoing weight loss, it was elected to proceed to cholecystectomy.  Patient had SCD stockings for DVT prevention.  Antibiotics were not indicated.  Operative note: With the patient adequate general endotracheal anesthesia the abdomen was cleansed with ChloraPrep and draped.  In Trendelenburg position a Varies needle was placed through entrance umbilical incision umbilical incision.  After the hanging drop test the abdomen was insufflated with CO2 at 10 mmHg pressure.  A 10 mm Step  port was expanded and inspection showed no evidence of injury from initial port placement.  An 11 mm XL port was placed in the epigastrium and 2-5 mm Step Ports were placed in the right lateral abdominal wall.  Inspection of the anterior abdominal wall was unremarkable.  No intra-abdominal lesions were identified.  Inspection of the epigastric port site showed no evidence of injury from initial port placement.  The patient was placed in reverse Trendelenburg position and rolled to the left.  The gallbladder was not inflamed.  This was placed on cephalad traction.  The neck of the gallbladder was cleared and a relatively short cystic duct was noted.  The gallbladder was somewhat "floppy", raising question  of intermittent torsion.  The cystic duct and cystic artery were identified.  Fluoroscopic cholangiograms were completed using 1 half-strength Conray 60.  A Kumar clamp was utilized.  The exam showed prompt filling of the cystic and common duct with free flow into the duodenum and reflux in the common hepatic duct.  The cystic duct and cystic arteries were doubly clipped and divided.  While the gallbladder was being removed from the liver bed, it was thin-walled punctured and a small bile spill obtained.  This was irrigated the end of the procedure.  All dissected along the liver bed and a superficial venous lake was identified which accounted for all of the bleeding.  This was eventually controlled with application of hemoclips x2 within the bed.  The gallbladder was removed through the umbilical port site in an Endo Catch bag.  Later inspection showed at least gallbladder polyp/cholesterol stones.  One was in the neck of the gallbladder.  After reestablishing pneumoperitoneum the abdomen was irrigated with 1200 cc of lactated Ringer solution.  Reinspection of the liver bed showed excellent hemostasis.  A large sheet of Surgicel was placed in the area.  Abdominal pressure was dropped to 5 mm and after 4-minute observation.  Reinspection showed no bleeding.  Final irrigation of the abdomen was undertaken.  Ports removed under direct vision as the abdomen was desufflated.  Skin incisions were closed with 4-0 Vicryl subcuticular sutures.  Benzoin, Steri-Strips, Telfa and Tegaderm dressings were applied.  Prior to dressing placement a total of 20 cc of 0.5% Marcaine with 1: 200,000's of epinephrine was injected around the port sites for postoperative analgesia.  Patient had stable vital signs to the procedure.  The patient was taken to  the PACU in stable

## 2021-05-28 NOTE — H&P (Signed)
Suzanne Copeland [id number] 1986/02/12     HPI: 35 y/o with abdominal pain and multiple gallbladder polyps. No other source for pain has been identified.  She is aware that no guarantee of pain relief with cholecystectomy.   Medications Prior to Admission  Medication Sig Dispense Refill Last Dose   cyclobenzaprine (FLEXERIL) 10 MG tablet Take 10 mg by mouth 2 (two) times daily as needed for muscle spasms.   05/27/2021   OVER THE COUNTER MEDICATION Take 1 tablet by mouth every Monday, Wednesday, and Friday. Ferritin otc supplement (Patient not taking: Reported on 05/26/2021)      topiramate (TOPAMAX) 100 MG tablet Take 100 mg by mouth 2 (two) times daily.   05/28/2021 at 0545   traZODone (DESYREL) 50 MG tablet Take 50 mg by mouth at bedtime.   05/26/2021   Ubrogepant (UBRELVY) 50 MG TABS Take 50 mg by mouth daily as needed (migraines).   05/27/2021   VITAMIN D PO Take 1 capsule by mouth daily.   05/27/2021   Eptinezumab-jjmr (VYEPTI) 100 MG/ML injection Inject 100 mg into the vein every 3 (three) months.      linaclotide (LINZESS) 145 MCG CAPS capsule Take 145 mcg by mouth daily before breakfast.      pantoprazole (PROTONIX) 40 MG tablet Take 40 mg by mouth in the morning and at bedtime. (Patient not taking: Reported on 05/26/2021)      Allergies  Allergen Reactions   Ciprofloxacin Rash and Hives   Past Medical History:  Diagnosis Date   Migraine    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([place])    Past Surgical History:  Procedure Laterality Date   CESAREAN SECTION     2x   COLONOSCOPY  04/2021   UPPER GI ENDOSCOPY  04/2021   Social History   Socioeconomic History   Marital status: Single    Spouse name: Not on file   Number of children: Not on file   Years of education: Not on file   Highest education level: Not on file  Occupational History   Not on file  Tobacco Use   Smoking status: Never   Smokeless tobacco: Never  Vaping Use   Vaping Use: Never used  Substance and Sexual Activity   Alcohol use: Not  Currently    Alcohol/week: 1.0 standard drink    Types: 1 Standard drinks or equivalent per week    Comment: rarely   Drug use: Not Currently   Sexual activity: Not on file  Other Topics Concern   Not on file  Social History Narrative   Not on file   Social Determinants of Health   Financial Resource Strain: Not on file  Food Insecurity: Not on file  Transportation Needs: Not on file  Physical Activity: Not on file  Stress: Not on file  Social Connections: Not on file  Intimate Partner Violence: Not on file   Social History   Social History Narrative   Not on file     ROS: Negative.     PE: HEENT: Negative. Lungs: Clear. Cardio: RR. Suzanne Copeland Suzanne Copeland    Assessment/Plan:  Proceed with planned cholecystectomy.   05/28/2021

## 2021-05-28 NOTE — Anesthesia Preprocedure Evaluation (Signed)
Anesthesia Evaluation  Patient identified by MRN, date of birth, ID band Patient awake    Reviewed: Allergy & Precautions, H&P , NPO status , Patient's Chart, lab work & pertinent test results, reviewed documented beta blocker date and time   History of Anesthesia Complications Negative for: history of anesthetic complications  Airway Mallampati: I  TM Distance: >3 FB Neck ROM: full    Dental  (+) Dental Advidsory Given, Teeth Intact, Missing   Pulmonary neg pulmonary ROS,    Pulmonary exam normal breath sounds clear to auscultation       Cardiovascular Exercise Tolerance: Good negative cardio ROS Normal cardiovascular exam Rhythm:regular Rate:Normal     Neuro/Psych  Headaches, neg Seizures negative psych ROS   GI/Hepatic Neg liver ROS, GERD  ,  Endo/Other  negative endocrine ROS  Renal/GU negative Renal ROS  negative genitourinary   Musculoskeletal   Abdominal   Peds  Hematology negative hematology ROS (+)   Anesthesia Other Findings Past Medical History: No date: Migraine No date: 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([facility])   Reproductive/Obstetrics negative OB ROS                             Anesthesia Physical Anesthesia Plan  ASA: 2  Anesthesia Plan: General   Post-op Pain Management:    Induction: Intravenous  PONV Risk Score and Plan: 3 and Ondansetron, Dexamethasone, Midazolam, Treatment may vary due to age or medical condition and Promethazine  Airway Management Planned: Oral ETT  Additional Equipment:   Intra-op Plan:   Post-operative Plan: Extubation in OR  Informed Consent: I have reviewed the patients History and Physical, chart, labs and discussed the procedure including the risks, benefits and alternatives for the proposed anesthesia with the patient or authorized representative who has indicated his/her understanding and acceptance.     Dental Advisory Given  Plan Discussed  with: Anesthesiologist, CRNA and Surgeon  Anesthesia Plan Comments:         Anesthesia Quick Evaluation

## 2021-05-28 NOTE — Anesthesia Procedure Notes (Signed)
Procedure Name: Intubation Date/Time: 05/28/2021 10:32 AM Performed by: Demetrius Charity, CRNA Pre-anesthesia Checklist: Patient identified, Patient being monitored, Timeout performed, Emergency Drugs available and Suction available Patient Re-evaluated:Patient Re-evaluated prior to induction Oxygen Delivery Method: Circle system utilized Preoxygenation: Pre-oxygenation with 100% oxygen Induction Type: IV induction Ventilation: Mask ventilation without difficulty Laryngoscope Size: 3 and McGraph Grade View: Grade I Tube type: Oral Tube size: 6.5 mm Number of attempts: 1 Airway Equipment and Method: Stylet Placement Confirmation: ETT inserted through vocal cords under direct vision, positive ETCO2 and breath sounds checked- equal and bilateral Secured at: 20 cm Tube secured with: Tape Dental Injury: Teeth and Oropharynx as per pre-operative assessment

## 2021-05-28 NOTE — Transfer of Care (Signed)
Immediate Anesthesia Transfer of Care Note  Patient: Suzanne Copeland  Procedure(s) Performed: LAPAROSCOPIC CHOLECYSTECTOMY WITH INTRAOPERATIVE CHOLANGIOGRAM (Abdomen)  Patient Location: PACU  Anesthesia Type:General  Level of Consciousness: drowsy  Airway & Oxygen Therapy: Patient Spontanous Breathing and Patient connected to nasal cannula oxygen  Post-op Assessment: Report given to RN and Post -op Vital signs reviewed and stable  Post vital signs: Reviewed and stable  Last Vitals:  Vitals Value Taken Time  BP 103/53 05/28/21 1149  Temp    Pulse 93 05/28/21 1150  Resp 16 05/28/21 1150  SpO2 100 % 05/28/21 1150  Vitals shown include unvalidated device data.  Last Pain:  Vitals:   05/28/21 0827  TempSrc: Temporal  PainSc: 8       Patients Stated Pain Goal: 3 (97/98/92 1194)  Complications: No notable events documented.

## 2021-05-28 NOTE — Anesthesia Postprocedure Evaluation (Signed)
Anesthesia Post Note  Patient: Designer, industrial/product  Procedure(s) Performed: LAPAROSCOPIC CHOLECYSTECTOMY WITH INTRAOPERATIVE CHOLANGIOGRAM (Abdomen)  Patient location during evaluation: PACU Anesthesia Type: General Level of consciousness: awake and alert Pain management: pain level controlled Vital Signs Assessment: post-procedure vital signs reviewed and stable Respiratory status: spontaneous breathing, nonlabored ventilation, respiratory function stable and patient connected to nasal cannula oxygen Cardiovascular status: blood pressure returned to baseline and stable Postop Assessment: no apparent nausea or vomiting Anesthetic complications: no   No notable events documented.   Last Vitals:  Vitals:   05/28/21 1305 05/28/21 1415  BP: (!) 111/54 (!) 98/52  Pulse: 76 78  Resp: 15 14  Temp: (!) 36.1 C   SpO2: 98% 100%    Last Pain:  Vitals:   05/28/21 1415  TempSrc:   PainSc: 4                  Martha Clan

## 2021-05-28 NOTE — Discharge Instructions (Addendum)

## 2021-05-29 ENCOUNTER — Encounter: Payer: Self-pay | Admitting: General Surgery

## 2021-05-31 LAB — SURGICAL PATHOLOGY

## 2021-08-11 ENCOUNTER — Other Ambulatory Visit: Payer: Self-pay

## 2021-08-11 ENCOUNTER — Emergency Department: Payer: Managed Care, Other (non HMO)

## 2021-08-11 ENCOUNTER — Emergency Department
Admission: EM | Admit: 2021-08-11 | Discharge: 2021-08-11 | Discharge status: Against Medical Advice | Payer: Managed Care, Other (non HMO) | Attending: Emergency Medicine | Admitting: Emergency Medicine

## 2021-08-11 DIAGNOSIS — R519 Headache, unspecified: Secondary | ICD-10-CM | POA: Insufficient documentation

## 2021-08-11 DIAGNOSIS — G43909 Migraine, unspecified, not intractable, without status migrainosus: Secondary | ICD-10-CM | POA: Insufficient documentation

## 2021-08-11 LAB — CBC
HCT: 41.8 % (ref 36.0–46.0)
Hemoglobin: 13.1 g/dL (ref 12.0–15.0)
MCH: 27.2 pg (ref 26.0–34.0)
MCHC: 31.3 g/dL (ref 30.0–36.0)
MCV: 86.9 fL (ref 80.0–100.0)
Platelets: 354 10*3/uL (ref 150–400)
RBC: 4.81 MIL/uL (ref 3.87–5.11)
RDW: 13.2 % (ref 11.5–15.5)
WBC: 7.8 10*3/uL (ref 4.0–10.5)
nRBC: 0 % (ref 0.0–0.2)

## 2021-08-11 LAB — BASIC METABOLIC PANEL
Anion gap: 9 (ref 5–15)
BUN: 18 mg/dL (ref 6–20)
CO2: 20 mmol/L — ABNORMAL LOW (ref 22–32)
Calcium: 9 mg/dL (ref 8.9–10.3)
Chloride: 113 mmol/L — ABNORMAL HIGH (ref 98–111)
Creatinine, Ser: 0.71 mg/dL (ref 0.44–1.00)
GFR, Estimated: >60 mL/min (ref >60)
Glucose, Bld: 99 mg/dL (ref 70–99)
Potassium: 3.9 mmol/L (ref 3.5–5.1)
Sodium: 142 mmol/L (ref 135–145)

## 2021-08-11 MED ORDER — METOCLOPRAMIDE HCL 5 MG/ML IJ SOLN
10.0000 mg | Freq: Once | INTRAMUSCULAR | Status: DC
  Filled 2021-08-11: qty 2

## 2021-08-11 MED ORDER — DIPHENHYDRAMINE HCL 50 MG/ML IJ SOLN
25.0000 mg | Freq: Once | INTRAMUSCULAR | Status: DC
  Filled 2021-08-11: qty 1

## 2021-08-11 MED ORDER — DEXAMETHASONE 4 MG PO TABS
12.0000 mg | ORAL_TABLET | Freq: Once | ORAL | Status: AC
  Administered 2021-08-11: 12 mg via ORAL
  Filled 2021-08-11: qty 3

## 2021-08-11 MED ORDER — DIPHENHYDRAMINE HCL 25 MG PO CAPS
25.0000 mg | ORAL_CAPSULE | Freq: Once | ORAL | Status: AC
  Administered 2021-08-11: 25 mg via ORAL
  Filled 2021-08-11: qty 1

## 2021-08-11 MED ORDER — SODIUM CHLORIDE 0.9 % IV BOLUS
1000.0000 mL | Freq: Once | INTRAVENOUS | Status: AC
  Administered 2021-08-11: 1000 mL via INTRAVENOUS

## 2021-08-11 MED ORDER — DEXAMETHASONE SODIUM PHOSPHATE 10 MG/ML IJ SOLN
6.0000 mg | Freq: Once | INTRAMUSCULAR | Status: DC
  Filled 2021-08-11: qty 1

## 2021-08-11 MED ORDER — METOCLOPRAMIDE HCL 5 MG/ML IJ SOLN
10.0000 mg | Freq: Once | INTRAMUSCULAR | Status: AC
  Administered 2021-08-11: 10 mg via INTRAMUSCULAR
  Filled 2021-08-11: qty 2

## 2021-08-11 NOTE — ED Notes (Signed)
RN and Velna Hatchet, EMT-P attempted to stick pt multiple times for IV placement.

## 2021-08-11 NOTE — ED Triage Notes (Signed)
See first nurse note- [facility]. Reports x10 days of migraine, reports vision has been dizzy and she has had issues walking and with balance. Reports constant nasal drainage and nose bleeds. Reports her head is tender to the touch.  Reports negative covid/flu/ UA at urgent care.

## 2021-08-11 NOTE — ED Provider Notes (Signed)
[facility] Provider Note    Event Date/Time   First MD Initiated Contact with Patient 08/11/21 1516     (approximate)   History   Migraine   HPI  Suzanne Copeland is a 36 y.o. female with history of migraines and prolactinoma who presents with a headache over approximate last 10 to 12 days, gradual onset, persistent course, diffuse and throbbing.  It is associated with photophobia but no phonophobia.  The patient has had nausea and vomiting multiple times per day over the last several days.  She states that the time course is atypical for her usual migraines, and it has not been successfully treated with her usual abortive medications.  She states that in the last few days she has also had a runny nose and occasional small amount of nosebleeding.  She also reports a sensation of loss of balance in the last several days, and states she is lightheaded.  She also reports neck stiffness.  She has not had this with migraines previously.     Physical Exam   Triage Vital Signs: ED Triage Vitals  Enc Vitals Group     BP 08/11/21 1453 106/62     Pulse Rate 08/11/21 1453 96     Resp 08/11/21 1453 18     Temp 08/11/21 1453 97.7 F (36.5 C)     Temp Source 08/11/21 1453 Oral     SpO2 08/11/21 1453 100 %     Weight 08/11/21 1451 123 lb (55.8 kg)     Height 08/11/21 1451 5\' 1"  (1.549 m)     Head Circumference --      Peak Flow --      Pain Score 08/11/21 1451 7     Pain Loc --      Pain Edu? --      Excl. in [place]? --     Most recent vital signs: Vitals:   08/11/21 1453  BP: 106/62  Pulse: 96  Resp: 18  Temp: 97.7 F (36.5 C)  SpO2: 100%    General: Awake, no distress.  CV:  Good peripheral perfusion.  Resp:  Normal effort.  Abd:  No distention.  Other:  EOMI.  PERRLA.  Mild photophobia.  Cranial nerves III through XII intact.  Motor and sensory intact in all extremities.  Normal coordination with no ataxia on finger-to-nose.  No pronator drift.  Normal  gait.  Minor hesitation with heel-to-toe gait but patient is able to do without assistance.  Neck supple.  Full range of motion.  No meningeal signs.   ED Results / Procedures / Treatments   Labs (all labs ordered are listed, but only abnormal results are displayed) Labs Reviewed  BASIC METABOLIC PANEL - Abnormal; Notable for the following components:      Result Value   Chloride 113 (*)    CO2 20 (*)    All other components within normal limits  CBC  PROLACTIN  CBG MONITORING, ED  POC URINE PREG, ED     EKG  ED ECG REPORT I, Arta Silence, the attending physician, personally viewed and interpreted this ECG.  Date: 08/11/2021 EKG Time: 1502 Rate: 86 Rhythm: normal sinus rhythm QRS Axis: Right axis Intervals: normal ST/T Wave abnormalities: normal Narrative Interpretation: no evidence of acute ischemia   RADIOLOGY  MR brain: I independently viewed and interpreted the images; there is no ICH, mass, or other acute abnormality.  PROCEDURES:  Critical Care performed: No  Procedures   MEDICATIONS ORDERED  IN ED: Medications  sodium chloride 0.9 % bolus 1,000 mL (0 mLs Intravenous Stopped 08/11/21 1936)  metoCLOPramide (REGLAN) injection 10 mg (10 mg Intramuscular Given 08/11/21 1929)  diphenhydrAMINE (BENADRYL) capsule 25 mg (25 mg Oral Given 08/11/21 1928)  dexamethasone (DECADRON) tablet 12 mg (12 mg Oral Given 08/11/21 1929)     IMPRESSION / MDM / ASSESSMENT AND PLAN / ED COURSE  I reviewed the triage vital signs and the nursing notes.  36 year old female with history of migraines and prolactinoma presents with headache over the last 10 days which is longer lasting and somewhat more severe than her typical migraines and associated with a sensation of loss of balance, lightheadedness, as well as runny nose and vomiting.  I reviewed the past medical records; the [facility] and sent to the ED for labs and possible imaging.  I do not see any  prior charts for neurology visits in the last few years.  The patient was evaluated by endocrinology for pituitary microadenoma at [facility] in [place] in 2020 and had an MRI later that year with a pituitary lesion consistent with a microadenoma.  Her prolactin on 07/22/2021 was normal.  On exam the patient is overall well-appearing.  Her vital signs are normal.  She is afebrile.  Neurologic exam is normal.  There are no meningeal signs.  The patient has normal gait.  Differential diagnosis includes, but is not limited to, migraine, complex migraine, tension headache, or other headache syndrome, sinusitis, or less likely worsening of her pituitary lesion.  Given the change in pattern of her headache we will obtain an MRI.  I have ordered fluids, Reglan, Benadryl, and steroid.  ----------------------------------------- 8:47 PM on 08/11/2021 -----------------------------------------  MRI of the brain was negative.  The patient had a delay in receiving medication because she had difficult IV access and the IV team was unable to obtain an IV.  I then changed her medications to IM and p.o.  After receiving the medications, I went to reassess the patient and she was not in the room.  RN believed that the patient had gone to the bathroom.  When I went back, the patient is still not there.  FINAL CLINICAL IMPRESSION(S) / ED DIAGNOSES   Final diagnoses:  Acute nonintractable headache, unspecified headache type     Rx / DC Orders   ED Discharge Orders     None        Note:  This document was prepared using Dragon voice recognition software and may include unintentional dictation errors.    Arta Silence, MD 08/11/21 2049

## 2021-08-11 NOTE — ED Triage Notes (Signed)
First Nurse Note:  Arrives from [facility] for ED evaluation of Headache, loss of balance x 10 days.  Ambulates with easy and steady gait. Posture upright and relaxed.  MAE equally and strong. NAD

## 2021-08-11 NOTE — Progress Notes (Signed)
Pt has extremely poor vasculature. Veins extremely small. PIV placed with U/S with no success. MD aware

## 2021-08-20 ENCOUNTER — Encounter: Payer: Self-pay | Admitting: Neurology

## 2021-08-20 ENCOUNTER — Ambulatory Visit: Payer: Managed Care, Other (non HMO) | Admitting: Neurology

## 2021-08-20 VITALS — BP 106/67 | HR 102 | Ht 61.0 in | Wt 122.2 lb

## 2021-08-20 DIAGNOSIS — G43711 Chronic migraine without aura, intractable, with status migrainosus: Secondary | ICD-10-CM

## 2021-08-20 MED ORDER — EMGALITY 120 MG/ML ~~LOC~~ SOAJ: 120.0000 mg | SUBCUTANEOUS | 11 refills | Status: DC

## 2021-08-20 NOTE — Progress Notes (Signed)
[facility]    Provider:  Dr Suzanne Copeland Requesting Provider: Gerome Apley, MD Primary Care Provider:  Pcp, Copeland  CC:  migraine  HPI:  Suzanne Copeland is a 36 y.o. 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  Of note, PATIENT ALREADY SEES [facility] AND IS ON MULTIPLE MIGRAINE MEDICATIONS including Vyepti(she denies they were able to get it approved and she does not feel [facility] is helping her with migraines so she would like to transition here).  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  Suzanne Copeland saw patient last July 22, 2021 in consultation at the request of Suzanne Copeland regarding a pituitary microadenoma.  Patient had an MRI in 2009 to evaluate for chronic migraines which incidentally found a pituitary microadenoma.  She was referred to Suzanne Copeland for evaluation after expressing straw-colored thin fluid from her nipples, MRI in March 2022 noted a 2 x 2 millimeter pituitary microadenoma, labs in March 2022 found normal prolactin 12 and other hormones normal, since moving to [place] she continues to have severe frequent migraines, repeat MRI in October 2022 noted her pituitary microadenoma essentially unchanged at 3.1 x 1.4 x 2.8 mm.  She is concerned that this is the cause of her migraines and other symptoms.  She is on Flexeril, Vyepti, tizanidine, topiramate, trazodone, Ubrelvy.  Suzanne Copeland diagnosed her with a benign nonfunctioning pituitary microadenoma, he recheck prolactin, he reassured her that the microadenoma had very little risk for increasing size and Copeland further pituitary imaging was necessary.  Prolactin was again normal collected July 22, 2021.  She has had migraines since she was little, they found a pituitary tumor years ago, her migraines are severe,  she forgets things, she can't get her words out, affecting her work. They can be unilateral either side, skin is sensitive to the touch, pressure, pulsating/pounding/throbbing, light and sound sensitivity, nausea, smells can trigger a migraine, currently on day 14 of a migraines, radiates into the neck, ear pain but went to see ED and was tested. Over the last 6 months she has missed multiple days of work, 20 headache days a month, 10 moderate to severe migraine days a month, they usually 2 days but this migraine has been ongoing 14 days. Preventative is topiramate, Roselyn Meier is her acute management medication, tried magnesium. Denies stress or depression. Copeland medication overuse, Copeland aura, Her tubes are tied, not planning on having kids, discussed not getting pregnant on Emgality for 6 months after starting. Copeland other focal neurologic deficits, associated symptoms, inciting events or modifiable factors.   Reviewed notes, labs and imaging from outside physicians, which showed:   From a thorough review of records, medications tried that can be used In migraine management include: Meds tried:  topiramate(since 2019), blood pressure medications contraindicated gave her hypotension , amitriptyline and nortriptyline (side effects), aimovig(tried 4 months stopped working), has not tried Teaching laboratory technician or ajovy, imitrex PO and injections, rizatriptan, effexor(makes her sick),  Cymbalta. Tried botox for a year (she has travelled around [place] and came to [place] last year but has seen multiple neurologists in the past), nurtec  MRI brain 04/02/2021: reviewed report, do not have access to images  FINDINGS: The sella turcica is normal in size without enlargement. The pituitary gland is normal in size. There is a 3.1 x 1.4 x 2.8 mm  abnormality in the right superior aspect of the pituitary gland. This demonstrates increased signal intensity on T1-weighted images and demonstrates Copeland enhancement on the postcontrast images. This  could represent a cyst or small pituitary microadenoma. By description, this lesion measured 2 x 2 mm on the previous study in 2020. It may be slightly enlarged. The remainder of the pituitary gland is normal in appearance with homogeneous signal intensity and homogeneous enhancement. Copeland parasellar abnormality noted.   Whole brain images demonstrate the ventricles to be normal without dilatation, mass effect, or midline shift. Copeland abnormal enhancement in the brain parenchyma.  Review of Systems: Patient complains of symptoms per HPI as well as the following symptoms intractable headahc. Pertinent negatives and positives per HPI. All others negative.   Social History   Socioeconomic History   Marital status: Single    Spouse name: Not on file   Number of children: 2   Years of education: Not on file   Highest education level: Not on file  Occupational History   Not on file  Tobacco Use   Smoking status: Never   Smokeless tobacco: Never  Vaping Use   Vaping Use: Never used  Substance and Sexual Activity   Alcohol use: Not Currently    Alcohol/week: 1.0 standard drink    Types: 1 Standard drinks or equivalent per week    Comment: rarely   Drug use: Not Currently   Sexual activity: Not on file  Other Topics Concern   Not on file  Social History Narrative   Caffeine occas to help with headaches   Social Determinants of Health   Financial Resource Strain: Not on file  Food Insecurity: Not on file  Transportation Needs: Not on file  Physical Activity: Not on file  Stress: Not on file  Social Connections: Not on file  Intimate Partner Violence: Not on file    Family History  Problem Relation Age of Onset   Stroke Mother    Hypertension Mother    Diabetes Mother     Past Medical History:  Diagnosis Date   Migraine    Pituitary microadenoma ([place])    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([place])     Patient Active Problem List   Diagnosis Date Noted   Chronic migraine without aura, with  intractable migraine, so stated, with status migrainosus 08/22/2021    Past Surgical History:  Procedure Laterality Date   CESAREAN SECTION     2x   CHOLECYSTECTOMY N/A 05/28/2021   Procedure: LAPAROSCOPIC CHOLECYSTECTOMY WITH INTRAOPERATIVE CHOLANGIOGRAM;  Surgeon: Robert Bellow, MD;  Location: [facility] ORS;  Service: General;  Laterality: N/A;   COLONOSCOPY  04/2021   UPPER GI ENDOSCOPY  04/2021    Current Outpatient Medications  Medication Sig Dispense Refill   cyclobenzaprine (FLEXERIL) 10 MG tablet Take 10 mg by mouth 2 (two) times daily as needed for muscle spasms.     Galcanezumab-gnlm (EMGALITY) 120 MG/ML SOAJ Inject 120 mg into the skin every 30 (thirty) days. 1.12 mL 11   pantoprazole (PROTONIX) 40 MG tablet Take 40 mg by mouth in the morning and at bedtime.     topiramate (TOPAMAX) 100 MG tablet Take 100 mg by mouth 2 (two) times daily.     traZODone (DESYREL) 50 MG tablet Take 50 mg by mouth at bedtime.     Ubrogepant (UBRELVY) 50 MG TABS Take 50 mg by mouth daily as needed (migraines).     Copeland current facility-administered medications for this visit.    Allergies as of 08/20/2021 -  Review Complete 08/20/2021  Allergen Reaction Noted   Ciprofloxacin Hives and Rash 08/22/2018    Vitals: BP 106/67    Pulse (!) 102    Ht 5\' 1"  (1.549 m)    Wt 122 lb 3.2 oz (55.4 kg)    BMI 23.09 kg/m  Last Weight:  Wt Readings from Last 1 Encounters:  08/20/21 122 lb 3.2 oz (55.4 kg)   Last Height:   Ht Readings from Last 1 Encounters:  08/20/21 5\' 1"  (1.549 m)     Physical exam: Exam: Gen: NAD, conversant, well nourised, well groomed                     CV: RRR, Copeland MRG. Copeland Carotid Bruits. Copeland peripheral edema, warm, nontender Eyes: Conjunctivae clear without exudates or hemorrhage  Neuro: Detailed Neurologic Exam  Speech:    Speech is normal; fluent and spontaneous with normal comprehension.  Cognition:    The patient is oriented to person, place, and time;     recent  and remote memory intact;     language fluent;     normal attention, concentration,     fund of knowledge Cranial Nerves:    The pupils are equal, round, and reactive to light. The fundi are flat. Visual fields are full to finger confrontation. Extraocular movements are intact. Trigeminal sensation is intact and the muscles of mastication are normal. The face is symmetric. The palate elevates in the midline. Hearing intact. Voice is normal. Shoulder shrug is normal. The tongue has normal motion without fasciculations.   Coordination:    Normal  Gait:   normal.   Motor Observation:    Copeland asymmetry, Copeland atrophy, and Copeland involuntary movements noted. Tone:    Normal muscle tone.    Posture:    Posture is normal. normal erect    Strength:    Strength is V/V in the upper and lower limbs.      Sensation: intact to LT     Reflex Exam:  DTR's:    Deep tendon reflexes in the upper and lower extremities are normal bilaterally.   Toes:    The toes are downgoing bilaterally.   Clonus:    Clonus is absent.    Assessment/Plan:   36 y.o. 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  Of note, PATIENT ALREADY SEES [facility] AND IS ON MULTIPLE MIGRAINE MEDICATIONS including Vyepti(says they did not get it approved), topiramate, ubrelvy, effexor but states she is not happy with their care and she would like to transition to [place].  Reassured her that, as Suzanne Copeland also stated, her pituitary microadenoma is not the cause for her headaches and perceived memory loss.  We had a long talk about migraine management, current options for preventative and acute management, she has not tried Ajovy or Emgality I think that would be a great place to start.  She has had multiple MRIs of the brain even recently.  Emgality: First month is 2 injections (give samples) and then monthly after that.    Discussed: To prevent or  relieve headaches, try the following: Cool Compress. Lie down and place a cool compress on your head.  Avoid headache triggers. If certain foods or odors seem to have triggered your migraines in the past, avoid them. A headache diary might help you identify triggers.  Include physical activity in your daily routine. Try a daily walk  or other moderate aerobic exercise.  Manage stress. Find healthy ways to cope with the stressors, such as delegating tasks on your to-do list.  Practice relaxation techniques. Try deep breathing, yoga, massage and visualization.  Eat regularly. Eating regularly scheduled meals and maintaining a healthy diet might help prevent headaches. Also, drink plenty of fluids.  Follow a regular sleep schedule. Sleep deprivation might contribute to headaches Consider biofeedback. With this mind-body technique, you learn to control certain bodily functions -- such as muscle tension, heart rate and blood pressure -- to prevent headaches or reduce headache pain.    Proceed to emergency room if you experience new or worsening symptoms or symptoms do not resolve, if you have new neurologic symptoms or if headache is severe, or for any concerning symptom.   Provided education and documentation from American headache Society toolbox including articles on: chronic migraine medication overuse headache, chronic migraines, prevention of migraines, behavioral and other nonpharmacologic treatments for headache.   Orders Placed This Encounter  Procedures   CBC with Differential/Platelets   Comprehensive metabolic panel   TSH   Meds ordered this encounter  Medications   Galcanezumab-gnlm (EMGALITY) 120 MG/ML SOAJ    Sig: Inject 120 mg into the skin every 30 (thirty) days.    Dispense:  1.12 mL    Refill:  11    Cc: Suzanne Apley, MD,  Suzanne Copeland  Sarina Ill, MD  [facility] [address] [place], [place] [address]  Phone [phone] Fax  [phone]

## 2021-08-20 NOTE — Patient Instructions (Addendum)
Emgality: First month is 2 injections (give samples) and then monthly after that.   ? ?Galcanezumab injection ?What is this medication? ?GALCANEZUMAB (gal ka NEZ ue mab) is used to prevent migraines and treat cluster headaches. ?This medicine may be used for other purposes; ask your health care provider or pharmacist if you have questions. ?COMMON BRAND NAME(S): Emgality ?What should I tell my care team before I take this medication? ?They need to know if you have any of these conditions: ?an unusual or allergic reaction to galcanezumab, other medicines, foods, dyes, or preservatives ?pregnant or trying to get pregnant ?breast-feeding ?How should I use this medication? ?This medicine is for injection under the skin. You will be taught how to prepare and give this medicine. Use exactly as directed. Take your medicine at regular intervals. Do not take your medicine more often than directed. ?It is important that you put your used needles and syringes in a special sharps container. Do not put them in a trash can. If you do not have a sharps container, call your pharmacist or healthcare provider to get one. ?Talk to your pediatrician regarding the use of this medicine in children. Special care may be needed. ?Overdosage: If you think you have taken too much of this medicine contact a poison control center or emergency room at once. ?NOTE: This medicine is only for you. Do not share this medicine with others. ?What if I miss a dose? ?If you miss a dose, take it as soon as you can. If it is almost time for your next dose, take only that dose. Do not take double or extra doses. ?What may interact with this medication? ?Interactions are not expected. ?This list may not describe all possible interactions. Give your health care provider a list of all the medicines, herbs, non-prescription drugs, or dietary supplements you use. Also tell them if you smoke, drink alcohol, or use illegal drugs. Some items may interact with your  medicine. ?What should I watch for while using this medication? ?Tell your doctor or healthcare professional if your symptoms do not start to get better or if they get worse. ?What side effects may I notice from receiving this medication? ?Side effects that you should report to your doctor or health care professional as soon as possible: ?allergic reactions like skin rash, itching or hives, swelling of the face, lips, or tongue ?Side effects that usually do not require medical attention (report these to your doctor or health care professional if they continue or are bothersome): ?pain, redness, or irritation at site where injected ?This list may not describe all possible side effects. Call your doctor for medical advice about side effects. You may report side effects to FDA at 1-800-FDA-1088. ?Where should I keep my medication? ?Keep out of the reach of children. ?You will be instructed on how to store this medicine. Throw away any unused medicine after the expiration date on the label. ?NOTE: This sheet is a summary. It may not cover all possible information. If you have questions about this medicine, talk to your doctor, pharmacist, or health care provider. ?? 2022 Elsevier/Gold Standard (2017-11-24 00:00:00) ? ?

## 2021-08-22 ENCOUNTER — Encounter: Payer: Self-pay | Admitting: Neurology

## 2021-08-22 DIAGNOSIS — G43711 Chronic migraine without aura, intractable, with status migrainosus: Secondary | ICD-10-CM | POA: Insufficient documentation

## 2021-08-22 MED ORDER — EMGALITY 120 MG/ML ~~LOC~~ SOAJ: 240.0000 mg | Freq: Once | SUBCUTANEOUS | 11 refills | Status: AC

## 2021-08-23 ENCOUNTER — Encounter: Payer: Self-pay | Admitting: Neurology

## 2021-09-02 ENCOUNTER — Encounter: Payer: Self-pay | Admitting: Neurology

## 2021-09-02 NOTE — Telephone Encounter (Addendum)
Initiated PA for [employer] for pt.  KEY [id number], may require trying ajovy first.  (Gave her samples of emaglity when in the office).  Approval emaglity [id number] thru 03-06-22.  Fax confirmation received Pharmacy [facility] [phone].   ?

## 2021-09-06 ENCOUNTER — Other Ambulatory Visit: Payer: Self-pay | Admitting: Obstetrics and Gynecology

## 2021-09-27 ENCOUNTER — Encounter: Payer: Self-pay | Admitting: Neurology

## 2021-10-01 ENCOUNTER — Other Ambulatory Visit: Payer: Managed Care, Other (non HMO)

## 2021-10-05 NOTE — H&P (Signed)
Preoperative History and Physical ? ?Suzanne Copeland is a 36 y.o. G3P2 here for surgical management of Menorrhagia with irregular cycle and dysmenorrhea.   No significant preoperative concerns. ? ?History of Present Illness: She presented for pain and heavy periods.   ?She has had 2 c-sections, she had a BTL and one of the clamps came off and attached to her ovary.  During her menses she has intense pain, such that she wants to lie on the floor. She has had ultrasounds that showed cysts on 1 or both of her ovaries. With her periods she bleeds through a pad or tampon every 2 hours, passing massive clots.  Her periods normally last about 7-9 days.  Her last period lasted about 2.5 weeks.  She has tried oral contraceptives for the cysts and this didn't work.  So, she came off the medication.  She also has painful intercourse and reports lack of lubrication during intercourse.   ?  ?Her maternal aunt had ovarian cancer.  Her mother had a hysterectomy when she was roughly the same age for similar issues.   ?She had an abdominal/pelvic CT in 04/2021 that showed a suspected corpus luteal cyst on the left measuring 2 x 1.6 cm. Mild endometrial heterogeneity with thickness approximately 1.3 cm.   ?  ?The pain has been present since 2009.  More recently her periods have become heavier. She used to be able to go through pads/tampons in 4 hours.  Since 2015 she has been going through pads/tampons every two hours.  She occasionally has periods where she can go 4 hours without having to change pads/tampons.   ?  ?She has a history of migraines, unsure about aura.  ?  ?In 2009 she had a diagnostic laparoscopy when the clip was found.  Her c-sections were in 2005 and 2007.   ?  ?Pelvic ultrasound on 08/31/2021: ?Ultrasound demonstrates the following findings ?Adnexa: small right ovarian follicle noted, otherwise normal  ?Uterus: anteverted with endometrial stripe  15.9 mm ?Additional: Endometrial polyp measuring 1.2 x 0.76 x 1.2 cm. ?   ?Pap smear 08/24/2021: NILM, HPV negative, STI screen negative ? ?Proposed surgery: Robot assisted total laparoscopic hysterectomy, bilateral salpingectomy, cystoscopy  ?  ?    ?Past Medical History:  ?Diagnosis Date  ? Chronic abdominal pain, unspecified    ? Chronic gastritis    ? Constipation    ? Migraine headache    ? 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(CMS-H[facility])    ?  ?     ?Past Surgical History:  ?Procedure Laterality Date  ? COLONOSCOPY   05/10/2021  ?  Normal Colon biopsy/Repeat 47yr/TKT  ? EGD   05/10/2021  ?  Gastritis/GERD/No repeat/TKT  ? CHOLECYSTECTOMY   05/28/2021  ? CESAREAN SECTION N/A    ? CESAREAN SECTION N/A    ? exploratory gyn surgery N/A    ? LAPAROSCOPIC TUBAL LIGATION      ?  ?                 ?OB History  ?Gravida Para Term Preterm AB Living  ?3 2          ?SAB IAB Ectopic Molar Multiple Live Births   ?             ?   ?# Outcome Date GA Lbr Len/2nd Weight Sex Delivery Anes PTL Lv  ?3 Gravida                    ?2 Para                    ?  1 Para                    ?   ?Obstetric Comments  ?Age at first period 79  ?Age of first pregnancy 50  ?Patient denies any other pertinent gynecologic issues.  ?  ?      ?Current Outpatient Medications on File Prior to Visit  ?Medication Sig Dispense Refill  ? cyclobenzaprine (FLEXERIL) 10 MG tablet Take 1 tablet (10 mg total) by mouth 2 (two) times daily 60 tablet 2  ? galcanezumab-gnlm (EMGALITY PEN) 120 mg/mL PnIj Inject subcutaneously      ? pantoprazole (PROTONIX) 40 MG DR tablet Take 1 tablet (40 mg total) by mouth 2 (two) times daily before meals 60 tablet 6  ? traZODone (DESYREL) 50 MG tablet Take 1 tablet (50 mg total) by mouth at bedtime 30 tablet 2  ? ubrogepant 50 mg Tab Take '50mg'$  at headache onset. Can repeat after 2 hours if needed. Do not exceed '200mg'$  in 24 hours. 20 tablet 1  ? cephalexin (KEFLEX) 500 MG capsule TAKE 1 CAPSULE BY MOUTH THREE TIMES DAILY FOR 7 DAYS      ? eptinezumab-jjmr (VYEPTI) 100 mg/mL intravenous solution Inject 1 mL (100 mg total) into  the vein every 3 (three) months (Patient not taking: Reported on 04/29/2021) 1 mL 3  ? HYDROcodone-acetaminophen (NORCO) 5-325 mg tablet Take 1 tablet by mouth every 4 (four) hours as needed      ? neomycin-polymyxin-hydrocortisone (CORTISPORIN) otic suspension        ? topiramate (TOPAMAX) 100 MG tablet Take 1 tablet (100 mg total) by mouth 2 (two) times daily 60 tablet 2  ? ubrogepant (UBRELVY ORAL) Take by mouth      ?  ?No current facility-administered medications on file prior to visit.  ?  ?    ?Allergies  ?Allergen Reactions  ? Ciprofloxacin Rash and Hives  ?  ?  ?Social History:   reports that she has never smoked. She has never used smokeless tobacco. She reports current alcohol use. She reports that she does not use drugs. ?  ?     ?Family History  ?Problem Relation Age of Onset  ? Diabetes Mother    ? High blood pressure (Hypertension) Mother    ? Stroke Mother    ? Breast cancer Maternal Aunt    ? Gallbladder disease Maternal Aunt    ? Breast cancer Maternal Grandmother    ? Breast cancer Paternal Grandmother    ? Colon polyps Paternal Grandfather    ? Colon cancer Neg Hx    ?  ?  ?Review of Systems: Noncontributory ?  ?PHYSICAL EXAM: ?Blood pressure 110/78, pulse 98, height 154.9 cm ('5\' 1"'$ ), weight 59.5 kg (131 lb 3.2 oz). ?CONSTITUTIONAL: Well-developed, well-nourished female in no acute distress.  ?HENT:  Normocephalic, atraumatic, External right and left ear normal. Oropharynx is clear and moist ?EYES: Conjunctivae and EOM are normal. Pupils are equal, round, and reactive to light. No scleral icterus.  ?NECK: Normal range of motion, supple, no masses ?SKIN: Skin is warm and dry. No rash noted. Not diaphoretic. No erythema. No pallor. ?[place]: Alert and oriented to person, place, and time. Normal reflexes, muscle tone coordination. No cranial nerve deficit noted. ?PSYCHIATRIC: Normal mood and affect. Normal behavior. Normal judgment and thought content. ?CARDIOVASCULAR: Normal heart rate noted,  regular rhythm ?RESPIRATORY: Effort and breath sounds normal, no problems with respiration noted ?ABDOMEN: Soft, nontender, nondistended. ?PELVIC: Deferred ?MUSCULOSKELETAL: Normal range of  motion. No edema and no tenderness. 2+ distal pulses. ?  ?Labs: ?Recent Results  ?No results found for this or any previous visit (from the past 336 hour(s)).  ? ?  ?Imaging Studies: ?US pelvic transvaginal ?  ?Result Date: 08/31/2021 ?ULTRASOUND REPORT Location: [facility] Date of Service: 08/31/2021 Indications: Menorrhagia with irregular cycle Findings: The uterus is anteverted and measures 9.5 x 4.8 cm. Echo texture is homogenous without evidence of focal masses. The Endometrium measures 15.9 mm. There appears to be an endometrial polyp that measures 1.2 x 0.76 x 1.17 cm.  There appears to be blood flow within this polypoid lesion. Right Ovary measures 4.6 x 2.3 x 2.1  cm. It is normal in appearance. There is a follicle measuring 1.7 cm. Left Ovary measures 3.2 x 2.7 x 1.7 cm. It is normal in appearance. Survey of the adnexa demonstrates no adnexal masses. There is trace free fluid in the cul de sac. Impression: 1. Polypoid lesion within endometrium measuring 1.2 x 0.76 x 1.17 cm. 2. Otherwise, normal gynecologic ultrasound The ultrasound images and findings were reviewed by me and I agree with the above report. Clare Charon, MD [place] 08/31/2021 12:13 PM     ?  ?  ?Assessment: ?1. Menorrhagia with irregular cycle   ?2. Endometrial polyp   ?3. Dysmenorrhea   ?  ?  ?Plan: ?Patient will undergo surgical management with the above noted surgery.   The risks of surgery were discussed in detail with the patient including but not limited to: bleeding which may require transfusion or reoperation; infection which may require antibiotics; injury to surrounding organs which may involve bowel, bladder, ureters ; need for additional procedures including laparoscopy or laparotomy;  thromboembolic phenomenon, surgical site problems and other postoperative/anesthesia complications. Likelihood of success in alleviating the patient's condition was discussed. Routine postoperative instructi

## 2021-10-11 ENCOUNTER — Other Ambulatory Visit
Admission: RE | Admit: 2021-10-11 | Discharge: 2021-10-11 | Disposition: A | Discharge status: Home / Self Care | Payer: Managed Care, Other (non HMO) | Source: Ambulatory Visit | Attending: Obstetrics and Gynecology | Admitting: Obstetrics and Gynecology

## 2021-10-11 ENCOUNTER — Other Ambulatory Visit: Payer: Self-pay

## 2021-10-11 ENCOUNTER — Encounter
Admission: RE | Admit: 2021-10-11 | Discharge: 2021-10-11 | Disposition: A | Discharge status: Home / Self Care | Payer: Managed Care, Other (non HMO) | Source: Ambulatory Visit | Attending: Obstetrics and Gynecology | Admitting: Obstetrics and Gynecology

## 2021-10-11 DIAGNOSIS — N946 Dysmenorrhea, unspecified: Secondary | ICD-10-CM | POA: Diagnosis not present

## 2021-10-11 DIAGNOSIS — N921 Excessive and frequent menstruation with irregular cycle: Secondary | ICD-10-CM

## 2021-10-11 DIAGNOSIS — N840 Polyp of corpus uteri: Secondary | ICD-10-CM | POA: Insufficient documentation

## 2021-10-11 DIAGNOSIS — Z01812 Encounter for preprocedural laboratory examination: Secondary | ICD-10-CM | POA: Diagnosis present

## 2021-10-11 LAB — TYPE AND SCREEN
ABO/RH(D): B POS
Antibody Screen: NEGATIVE

## 2021-10-11 NOTE — Patient Instructions (Addendum)
Your procedure is scheduled on: 10/14/2021  ?Report to the Registration Desk on the 1st floor of the [facility]. ?To find out your arrival time, please call [phone] between 1PM - 3PM on: 10/13/2021  ? ?REMEMBER: ?Instructions that are not followed completely may result in serious medical risk, up to and including death; or upon the discretion of your surgeon and anesthesiologist your surgery may need to be rescheduled. ? ?Do not eat food after midnight the night before surgery.  ?No gum chewing, lozengers or hard candies. ? ?You may however, drink CLEAR liquids up to 2 hours before you are scheduled to arrive for your surgery. Do not drink anything within 2 hours of your scheduled arrival time. ? ?Clear liquids include: ?- water  ?- apple juice without pulp ?- gatorade (not RED colors) ?- black coffee or tea (Do NOT add milk or creamers to the coffee or tea) ?Do NOT drink anything that is not on this list. ? ? ? ?In addition, your doctor has ordered for you to drink the provided  ?Ensure Pre-Surgery Clear Carbohydrate Drink  ? ?Drinking this carbohydrate drink up to two hours before surgery helps to reduce insulin resistance and improve patient outcomes. Please complete drinking 2 hours prior to scheduled arrival time. ? ? ? ?One week prior to surgery: ?Stop Anti-inflammatories (NSAIDS) such as Advil, Aleve, Ibuprofen, Motrin, Naproxen, Naprosyn and Aspirin based products such as Excedrin, Goodys Powder, BC Powder. ? ?Stop ANY OVER THE COUNTER supplements until after surgery. ?You may however, continue to take Tylenol if needed for pain up until the day of surgery. ? ?No Alcohol for 24 hours before or after surgery. ? ?No Smoking including e-cigarettes for 24 hours prior to surgery.  ?No chewable tobacco products for at least 6 hours prior to surgery.  ?No nicotine patches on the day of surgery. ? ?Do not use any "recreational" drugs for at least a week prior to your surgery.  ?Please be advised that the  combination of cocaine and anesthesia may have negative outcomes, up to and including death. ?If you test positive for cocaine, your surgery will be cancelled. ? ?On the morning of surgery brush your teeth with toothpaste and water, you may rinse your mouth with mouthwash if you wish. ?Do not swallow any toothpaste or mouthwash. ? ?Use CHG Soap with shower as directed the night and day of surgery. ? ?Do not wear jewelry, make-up, hairpins, clips or nail polish. ? ?Do not wear lotions, powders, or perfumes.  ? ?Do not shave body from the neck down 48 hours prior to surgery just in case you cut yourself which could leave a site for infection.  ?Also, freshly shaved skin may become irritated if using the CHG soap. ? ?Contact lenses, hearing aids and dentures may not be worn into surgery. ? ?Do not bring valuables to the hospital. [facility] is not responsible for any missing/lost belongings or valuables.  ? ? ?Notify your doctor if there is any change in your medical condition (cold, fever, infection). ? ?Wear comfortable clothing (specific to your surgery type) to the hospital. ? ?After surgery, you can help prevent lung complications by doing breathing exercises.  ?Take deep breaths and cough every 1-2 hours. Your doctor may order a device called an Incentive Spirometer to help you take deep breaths. ? ?When coughing or sneezing, hold a pillow firmly against your incision with both hands. This is called ?splinting.? Doing this helps protect your incision. It also decreases belly discomfort. ? ?  If you are being admitted to the hospital overnight, leave your suitcase in the car. ?After surgery it may be brought to your room. ? ?If you are being discharged the day of surgery, you will not be allowed to drive home. ?You will need a responsible adult (18 years or older) to drive you home and stay with you that night.  ? ?If you are taking public transportation, you will need to have a responsible adult (18 years or  older) with you. ?Please confirm with your physician that it is acceptable to use public transportation.  ? ?Please call the [facility] at [phone] if you have any questions about these instructions. ? ?Surgery Visitation Policy: ? ?Patients undergoing a surgery or procedure may have two family members or support persons with them as long as the person is not COVID-19 positive or experiencing its symptoms.  ? ?Inpatient Visitation:   ? ?Visiting hours are 7 a.m. to 8 p.m. ?Up to four visitors are allowed at one time in a patient room, including children. The visitors may rotate out with other people during the day. One designated support person (adult) may remain overnight.  ?

## 2021-10-13 MED ORDER — CHLORHEXIDINE GLUCONATE 0.12 % MT SOLN: 15.0000 mL | Freq: Once | OROMUCOSAL | Status: AC

## 2021-10-13 MED ORDER — POVIDONE-IODINE 10 % EX SWAB
2.0000 "application " | Freq: Once | CUTANEOUS | Status: AC
  Administered 2021-10-14: 2 via TOPICAL

## 2021-10-13 MED ORDER — ORAL CARE MOUTH RINSE: 15.0000 mL | Freq: Once | OROMUCOSAL | Status: AC

## 2021-10-13 MED ORDER — CEFAZOLIN SODIUM-DEXTROSE 2-4 GM/100ML-% IV SOLN
2.0000 g | INTRAVENOUS | Status: AC
  Administered 2021-10-14: 2 g via INTRAVENOUS

## 2021-10-13 MED ORDER — FAMOTIDINE 20 MG PO TABS: 20.0000 mg | ORAL_TABLET | Freq: Once | ORAL | Status: AC

## 2021-10-13 MED ORDER — LACTATED RINGERS IV SOLN: INTRAVENOUS | Status: DC

## 2021-10-14 ENCOUNTER — Ambulatory Visit: Payer: Managed Care, Other (non HMO) | Admitting: Certified Registered"

## 2021-10-14 ENCOUNTER — Ambulatory Visit: Payer: Managed Care, Other (non HMO) | Admitting: Urgent Care

## 2021-10-14 ENCOUNTER — Ambulatory Visit
Admission: RE | Admit: 2021-10-14 | Discharge: 2021-10-14 | Disposition: A | Discharge status: Home / Self Care | Payer: Managed Care, Other (non HMO) | Attending: Obstetrics and Gynecology | Admitting: Obstetrics and Gynecology

## 2021-10-14 ENCOUNTER — Encounter: Payer: Self-pay | Admitting: Obstetrics and Gynecology

## 2021-10-14 ENCOUNTER — Other Ambulatory Visit: Payer: Self-pay

## 2021-10-14 ENCOUNTER — Encounter
Admission: RE | Disposition: A | Discharge status: Home / Self Care | Payer: Self-pay | Source: Home / Self Care | Attending: Obstetrics and Gynecology

## 2021-10-14 DIAGNOSIS — N840 Polyp of corpus uteri: Secondary | ICD-10-CM | POA: Diagnosis not present

## 2021-10-14 DIAGNOSIS — N8003 Adenomyosis of the uterus: Secondary | ICD-10-CM | POA: Insufficient documentation

## 2021-10-14 DIAGNOSIS — N946 Dysmenorrhea, unspecified: Secondary | ICD-10-CM | POA: Diagnosis present

## 2021-10-14 DIAGNOSIS — N921 Excessive and frequent menstruation with irregular cycle: Secondary | ICD-10-CM | POA: Diagnosis present

## 2021-10-14 DIAGNOSIS — Y658 Other specified misadventures during surgical and medical care: Secondary | ICD-10-CM | POA: Diagnosis not present

## 2021-10-14 DIAGNOSIS — G43909 Migraine, unspecified, not intractable, without status migrainosus: Secondary | ICD-10-CM | POA: Insufficient documentation

## 2021-10-14 DIAGNOSIS — N838 Other noninflammatory disorders of ovary, fallopian tube and broad ligament: Secondary | ICD-10-CM | POA: Insufficient documentation

## 2021-10-14 DIAGNOSIS — Z8041 Family history of malignant neoplasm of ovary: Secondary | ICD-10-CM | POA: Diagnosis not present

## 2021-10-14 DIAGNOSIS — S3723XA Laceration of bladder, initial encounter: Secondary | ICD-10-CM | POA: Diagnosis not present

## 2021-10-14 HISTORY — PX: ROBOTIC ASSISTED LAPAROSCOPIC HYSTERECTOMY AND SALPINGECTOMY: SHX6379

## 2021-10-14 HISTORY — PX: BLADDER REPAIR: SHX6721

## 2021-10-14 SURGERY — XI ROBOTIC ASSISTED LAPAROSCOPIC HYSTERECTOMY AND SALPINGECTOMY
Anesthesia: General | Laterality: Bilateral

## 2021-10-14 MED ORDER — DEXAMETHASONE SODIUM PHOSPHATE 10 MG/ML IJ SOLN
INTRAMUSCULAR | Status: AC
  Filled 2021-10-14: qty 1

## 2021-10-14 MED ORDER — CHLORHEXIDINE GLUCONATE 0.12 % MT SOLN
OROMUCOSAL | Status: AC
  Administered 2021-10-14: 15 mL via OROMUCOSAL
  Filled 2021-10-14: qty 15

## 2021-10-14 MED ORDER — OXYBUTYNIN CHLORIDE 5 MG PO TABS
ORAL_TABLET | ORAL | Status: AC
  Administered 2021-10-14: 5 mg via ORAL
  Filled 2021-10-14: qty 1

## 2021-10-14 MED ORDER — BUPIVACAINE HCL (PF) 0.5 % IJ SOLN
INTRAMUSCULAR | Status: AC
  Filled 2021-10-14: qty 30

## 2021-10-14 MED ORDER — OXYCODONE-ACETAMINOPHEN 5-325 MG PO TABS
ORAL_TABLET | ORAL | Status: AC
  Filled 2021-10-14: qty 1

## 2021-10-14 MED ORDER — PHENYLEPHRINE HCL (PRESSORS) 10 MG/ML IV SOLN
INTRAVENOUS | Status: DC | PRN
Start: 2021-10-14 — End: 2021-10-14
  Administered 2021-10-14: 160 ug via INTRAVENOUS
  Administered 2021-10-14 (×4): 80 ug via INTRAVENOUS
  Administered 2021-10-14: 160 ug via INTRAVENOUS
  Administered 2021-10-14: 80 ug via INTRAVENOUS
  Administered 2021-10-14 (×2): 160 ug via INTRAVENOUS

## 2021-10-14 MED ORDER — SODIUM CHLORIDE (PF) 0.9 % IJ SOLN
INTRAVENOUS | Status: DC | PRN
  Administered 2021-10-14: 300 mL via INTRAVESICAL

## 2021-10-14 MED ORDER — PROPOFOL 10 MG/ML IV BOLUS
INTRAVENOUS | Status: AC
  Filled 2021-10-14: qty 20

## 2021-10-14 MED ORDER — FENTANYL CITRATE (PF) 100 MCG/2ML IJ SOLN
INTRAMUSCULAR | Status: DC | PRN
  Administered 2021-10-14 (×5): 50 ug via INTRAVENOUS

## 2021-10-14 MED ORDER — IBUPROFEN 600 MG PO TABS: 600.0000 mg | ORAL_TABLET | Freq: Four times a day (QID) | ORAL | 0 refills | Status: DC

## 2021-10-14 MED ORDER — OXYCODONE HCL 5 MG PO TABS
ORAL_TABLET | ORAL | Status: AC
  Filled 2021-10-14: qty 1

## 2021-10-14 MED ORDER — FENTANYL CITRATE (PF) 100 MCG/2ML IJ SOLN
INTRAMUSCULAR | Status: AC
  Filled 2021-10-14: qty 2

## 2021-10-14 MED ORDER — DEXAMETHASONE SODIUM PHOSPHATE 10 MG/ML IJ SOLN
INTRAMUSCULAR | Status: DC | PRN
Start: 2021-10-14 — End: 2021-10-14
  Administered 2021-10-14: 10 mg via INTRAVENOUS

## 2021-10-14 MED ORDER — BUPIVACAINE HCL 0.5 % IJ SOLN
INTRAMUSCULAR | Status: DC | PRN
  Administered 2021-10-14: 15 mL

## 2021-10-14 MED ORDER — OXYCODONE-ACETAMINOPHEN 5-325 MG PO TABS: 1.0000 | ORAL_TABLET | Freq: Four times a day (QID) | ORAL | 0 refills | Status: AC | PRN

## 2021-10-14 MED ORDER — ROCURONIUM BROMIDE 10 MG/ML (PF) SYRINGE
PREFILLED_SYRINGE | INTRAVENOUS | Status: AC
  Filled 2021-10-14: qty 10

## 2021-10-14 MED ORDER — SUGAMMADEX SODIUM 200 MG/2ML IV SOLN
INTRAVENOUS | Status: DC | PRN
  Administered 2021-10-14: 200 mg via INTRAVENOUS

## 2021-10-14 MED ORDER — OXYBUTYNIN CHLORIDE 5 MG PO TABS: 5.0000 mg | ORAL_TABLET | Freq: Three times a day (TID) | ORAL | Status: DC

## 2021-10-14 MED ORDER — KETOROLAC TROMETHAMINE 30 MG/ML IJ SOLN
INTRAMUSCULAR | Status: DC | PRN
  Administered 2021-10-14: 30 mg via INTRAVENOUS

## 2021-10-14 MED ORDER — LIDOCAINE HCL (PF) 2 % IJ SOLN
INTRAMUSCULAR | Status: AC
  Filled 2021-10-14: qty 5

## 2021-10-14 MED ORDER — DEXMEDETOMIDINE (PRECEDEX) IN NS 20 MCG/5ML (4 MCG/ML) IV SYRINGE
PREFILLED_SYRINGE | INTRAVENOUS | Status: DC | PRN
  Administered 2021-10-14 (×2): 8 ug via INTRAVENOUS
  Administered 2021-10-14: 4 ug via INTRAVENOUS

## 2021-10-14 MED ORDER — PHENYLEPHRINE 80 MCG/ML (10ML) SYRINGE FOR IV PUSH (FOR BLOOD PRESSURE SUPPORT)
PREFILLED_SYRINGE | INTRAVENOUS | Status: AC
  Filled 2021-10-14: qty 10

## 2021-10-14 MED ORDER — OXYBUTYNIN CHLORIDE ER 10 MG PO TB24: 10.0000 mg | ORAL_TABLET | Freq: Every day | ORAL | 0 refills | Status: AC

## 2021-10-14 MED ORDER — FENTANYL CITRATE (PF) 100 MCG/2ML IJ SOLN
25.0000 ug | INTRAMUSCULAR | Status: DC | PRN
  Administered 2021-10-14 (×3): 25 ug via INTRAVENOUS

## 2021-10-14 MED ORDER — ROCURONIUM BROMIDE 100 MG/10ML IV SOLN
INTRAVENOUS | Status: DC | PRN
  Administered 2021-10-14: 50 mg via INTRAVENOUS
  Administered 2021-10-14: 30 mg via INTRAVENOUS
  Administered 2021-10-14: 20 mg via INTRAVENOUS

## 2021-10-14 MED ORDER — METHYLENE BLUE 1 % INJ SOLN
INTRAVENOUS | Status: AC
  Filled 2021-10-14: qty 10

## 2021-10-14 MED ORDER — ONDANSETRON 4 MG PO TBDP: 4.0000 mg | ORAL_TABLET | Freq: Four times a day (QID) | ORAL | 0 refills | Status: DC | PRN

## 2021-10-14 MED ORDER — MIDAZOLAM HCL 2 MG/2ML IJ SOLN
INTRAMUSCULAR | Status: DC | PRN
  Administered 2021-10-14: 2 mg via INTRAVENOUS

## 2021-10-14 MED ORDER — PROPOFOL 10 MG/ML IV BOLUS
INTRAVENOUS | Status: DC | PRN
  Administered 2021-10-14: 150 mg via INTRAVENOUS

## 2021-10-14 MED ORDER — ACETAMINOPHEN 10 MG/ML IV SOLN
INTRAVENOUS | Status: AC
  Filled 2021-10-14: qty 100

## 2021-10-14 MED ORDER — KETOROLAC TROMETHAMINE 30 MG/ML IJ SOLN
INTRAMUSCULAR | Status: AC
  Filled 2021-10-14: qty 1

## 2021-10-14 MED ORDER — ONDANSETRON HCL 4 MG/2ML IJ SOLN
INTRAMUSCULAR | Status: AC
  Filled 2021-10-14: qty 2

## 2021-10-14 MED ORDER — METOPROLOL TARTRATE 5 MG/5ML IV SOLN
INTRAVENOUS | Status: DC | PRN
  Administered 2021-10-14: 5 mg via INTRAVENOUS

## 2021-10-14 MED ORDER — CEFAZOLIN SODIUM-DEXTROSE 2-4 GM/100ML-% IV SOLN
INTRAVENOUS | Status: AC
Start: 2021-10-14 — End: 2021-10-14
  Filled 2021-10-14: qty 100

## 2021-10-14 MED ORDER — 0.9 % SODIUM CHLORIDE (POUR BTL) OPTIME
TOPICAL | Status: DC | PRN
  Administered 2021-10-14: 500 mL

## 2021-10-14 MED ORDER — MIDAZOLAM HCL 2 MG/2ML IJ SOLN
INTRAMUSCULAR | Status: AC
  Filled 2021-10-14: qty 2

## 2021-10-14 MED ORDER — ACETAMINOPHEN 10 MG/ML IV SOLN
INTRAVENOUS | Status: DC | PRN
  Administered 2021-10-14: 1000 mg via INTRAVENOUS

## 2021-10-14 MED ORDER — OXYCODONE-ACETAMINOPHEN 5-325 MG PO TABS: 1.0000 | ORAL_TABLET | ORAL | Status: DC | PRN

## 2021-10-14 MED ORDER — OXYCODONE HCL 5 MG PO TABS
5.0000 mg | ORAL_TABLET | Freq: Once | ORAL | Status: AC
  Administered 2021-10-14: 5 mg via ORAL

## 2021-10-14 MED ORDER — DEXMEDETOMIDINE HCL IN NACL 80 MCG/20ML IV SOLN
INTRAVENOUS | Status: AC
  Filled 2021-10-14: qty 20

## 2021-10-14 MED ORDER — ONDANSETRON HCL 4 MG/2ML IJ SOLN: 4.0000 mg | Freq: Once | INTRAMUSCULAR | Status: DC | PRN

## 2021-10-14 MED ORDER — ONDANSETRON HCL 4 MG/2ML IJ SOLN
INTRAMUSCULAR | Status: DC | PRN
  Administered 2021-10-14: 4 mg via INTRAVENOUS

## 2021-10-14 MED ORDER — FAMOTIDINE 20 MG PO TABS
ORAL_TABLET | ORAL | Status: AC
  Administered 2021-10-14: 20 mg via ORAL
  Filled 2021-10-14: qty 1

## 2021-10-14 SURGICAL SUPPLY — 81 items
ADH SKN CLS APL DERMABOND .7 (GAUZE/BANDAGES/DRESSINGS) ×3
APL PRP STRL LF DISP 70% ISPRP (MISCELLANEOUS) ×3
BACTOSHIELD CHG 4% 4OZ (MISCELLANEOUS) ×1
BAG DRN LRG CPC RND TRDRP CNTR (MISCELLANEOUS) ×3
BAG DRN RND TRDRP ANRFLXCHMBR (UROLOGICAL SUPPLIES) ×3
BAG URINE DRAIN 2000ML AR STRL (UROLOGICAL SUPPLIES) ×4 IMPLANT
BAG URO DRAIN 4000ML (MISCELLANEOUS) ×2 IMPLANT
BASIN GRAD PLASTIC 32OZ STRL (MISCELLANEOUS) ×2 IMPLANT
BLADE SURG SZ11 CARB STEEL (BLADE) ×4 IMPLANT
CANNULA CAP OBTURATR AIRSEAL 8 (CAP) ×4 IMPLANT
CATH FOLEY 2WAY  5CC 16FR (CATHETERS) ×1
CATH FOLEY 2WAY 5CC 16FR (CATHETERS) ×3
CATH URTH 16FR FL 2W BLN LF (CATHETERS) ×3 IMPLANT
CHLORAPREP W/TINT 26 (MISCELLANEOUS) ×4 IMPLANT
COVER MAYO STAND REUSABLE (DRAPES) ×4 IMPLANT
COVER TIP SHEARS 8 DVNC (MISCELLANEOUS) ×3 IMPLANT
COVER TIP SHEARS 8MM DA VINCI (MISCELLANEOUS) ×1
DEFOGGER SCOPE WARMER CLEARIFY (MISCELLANEOUS) ×4 IMPLANT
DERMABOND ADVANCED (GAUZE/BANDAGES/DRESSINGS) ×1
DERMABOND ADVANCED .7 DNX12 (GAUZE/BANDAGES/DRESSINGS) ×3 IMPLANT
DRAPE 3/4 80X56 (DRAPES) IMPLANT
DRAPE ARM DVNC X/XI (DISPOSABLE) ×12 IMPLANT
DRAPE DA VINCI XI ARM (DISPOSABLE) ×4
DRAPE ROBOT W/ LEGGING 30X125 (DRAPES) ×4 IMPLANT
DRAPE UNDER BUTTOCK W/FLU (DRAPES) ×4 IMPLANT
ELECT REM PT RETURN 9FT ADLT (ELECTROSURGICAL) ×4
ELECTRODE REM PT RTRN 9FT ADLT (ELECTROSURGICAL) ×3 IMPLANT
GAUZE 4X4 16PLY ~~LOC~~+RFID DBL (SPONGE) ×4 IMPLANT
GLOVE BIO SURGEON STRL SZ7 (GLOVE) ×12 IMPLANT
GLOVE SURG UNDER POLY LF SZ7.5 (GLOVE) ×12 IMPLANT
GOWN STRL REUS W/ TWL LRG LVL3 (GOWN DISPOSABLE) ×9 IMPLANT
GOWN STRL REUS W/ TWL XL LVL3 (GOWN DISPOSABLE) ×3 IMPLANT
GOWN STRL REUS W/TWL LRG LVL3 (GOWN DISPOSABLE) ×12
GOWN STRL REUS W/TWL XL LVL3 (GOWN DISPOSABLE) ×4
GYRUS RUMI II 3.5CM BLUE (DISPOSABLE) ×4
IRRIGATION STRYKERFLOW (MISCELLANEOUS) ×1 IMPLANT
IRRIGATOR STRYKERFLOW (MISCELLANEOUS) ×4
IV LACTATED RINGERS 1000ML (IV SOLUTION) ×4 IMPLANT
IV NS 1000ML (IV SOLUTION) ×4
IV NS 1000ML BAXH (IV SOLUTION) ×3 IMPLANT
KIT PINK PAD W/HEAD ARE REST (MISCELLANEOUS) ×4
KIT PINK PAD W/HEAD ARM REST (MISCELLANEOUS) ×3 IMPLANT
KIT TURNOVER CYSTO (KITS) ×4 IMPLANT
LABEL OR SOLS (LABEL) ×4 IMPLANT
MANIFOLD NEPTUNE II (INSTRUMENTS) ×4 IMPLANT
MANIPULATOR VCARE LG CRV RETR (MISCELLANEOUS) IMPLANT
MANIPULATOR VCARE SML CRV RETR (MISCELLANEOUS) IMPLANT
MANIPULATOR VCARE STD CRV RETR (MISCELLANEOUS) IMPLANT
NEEDLE HYPO 22GX1.5 SAFETY (NEEDLE) ×4 IMPLANT
NS IRRIG 500ML POUR BTL (IV SOLUTION) ×4 IMPLANT
OBTURATOR OPTICAL STANDARD 8MM (TROCAR) ×1
OBTURATOR OPTICAL STND 8 DVNC (TROCAR) ×3
OBTURATOR OPTICALSTD 8 DVNC (TROCAR) ×3 IMPLANT
OCCLUDER COLPOPNEUMO (BALLOONS) ×4 IMPLANT
PACK LAP CHOLECYSTECTOMY (MISCELLANEOUS) ×4 IMPLANT
PAD OB MATERNITY 4.3X12.25 (PERSONAL CARE ITEMS) ×4 IMPLANT
PAD PREP 24X41 OB/GYN DISP (PERSONAL CARE ITEMS) ×4 IMPLANT
RUMI II GYRUS 3.5CM BLUE (DISPOSABLE) ×1 IMPLANT
SCISSORS METZENBAUM CVD 33 (INSTRUMENTS) ×2 IMPLANT
SCRUB CHG 4% DYNA-HEX 4OZ (MISCELLANEOUS) ×3 IMPLANT
SEAL CANN UNIV 5-8 DVNC XI (MISCELLANEOUS) ×12 IMPLANT
SEAL XI 5MM-8MM UNIVERSAL (MISCELLANEOUS) ×4
SEALER VESSEL DA VINCI XI (MISCELLANEOUS) ×1
SEALER VESSEL EXT DVNC XI (MISCELLANEOUS) ×3 IMPLANT
SET CYSTO W/LG BORE CLAMP LF (SET/KITS/TRAYS/PACK) ×4 IMPLANT
SET TUBE FILTERED XL AIRSEAL (SET/KITS/TRAYS/PACK) ×4 IMPLANT
SOLUTION ELECTROLUBE (MISCELLANEOUS) ×4 IMPLANT
SPONGE T-LAP 18X18 ~~LOC~~+RFID (SPONGE) ×2 IMPLANT
SURGILUBE 2OZ TUBE FLIPTOP (MISCELLANEOUS) ×4 IMPLANT
SUT DVC VLOC 90 3-0 CV23 UNDY (SUTURE) ×2 IMPLANT
SUT DVC VLOC 90 3-0 CV23 VLT (SUTURE) ×4
SUT MNCRL 4-0 (SUTURE) ×4
SUT MNCRL 4-0 27XMFL (SUTURE) ×3
SUT STRATAFIX 0 PDS+ CT-2 23 (SUTURE)
SUTURE DVC VLC 90 3-0 CV23 VLT (SUTURE) ×1 IMPLANT
SUTURE MNCRL 4-0 27XMF (SUTURE) ×3 IMPLANT
SUTURE STRATFX 0 PDS+ CT-2 23 (SUTURE) ×2 IMPLANT
SYR 10ML LL (SYRINGE) ×4 IMPLANT
SYR 50ML LL SCALE MARK (SYRINGE) ×4 IMPLANT
TIP UTERINE 6.7X8CM BLUE DISP (MISCELLANEOUS) ×2 IMPLANT
WATER STERILE IRR 500ML POUR (IV SOLUTION) ×4 IMPLANT

## 2021-10-14 NOTE — Discharge Instructions (Addendum)

## 2021-10-14 NOTE — Transfer of Care (Signed)
Immediate Anesthesia Transfer of Care Note ? ?Patient: Suzanne Copeland ? ?Procedure(s) Performed: XI ROBOTIC ASSISTED LAPAROSCOPIC HYSTERECTOMY AND SALPINGECTOMY (Bilateral) ?ROBOTIC ASSISTED BLADDER REPAIR ? ?Patient Location: PACU ? ?Anesthesia Type:General ? ?Level of Consciousness: awake, drowsy and patient cooperative ? ?Airway & Oxygen Therapy: Patient Spontanous Breathing and Patient connected to face mask oxygen ? ?Post-op Assessment: Report given to RN and Post -op Vital signs reviewed and stable ? ?Post vital signs: Reviewed and stable ? ?Last Vitals:  ?Vitals Value Taken Time  ?BP 100/61 10/14/21 1100  ?Temp    ?Pulse    ?Resp 21 10/14/21 1101  ?SpO2    ?Vitals shown include unvalidated device data. ? ?Last Pain:  ?Vitals:  ? 10/14/21 0705  ?TempSrc: Oral  ?PainSc: 2   ?   ? ?  ? ?Complications: No notable events documented. ?

## 2021-10-14 NOTE — Anesthesia Procedure Notes (Signed)
Procedure Name: Intubation ?Date/Time: 10/14/2021 7:39 AM ?Performed by: Jerrye Noble, CRNA ?Pre-anesthesia Checklist: Patient identified, Emergency Drugs available, Suction available and Patient being monitored ?Patient Re-evaluated:Patient Re-evaluated prior to induction ?Oxygen Delivery Method: Circle system utilized ?Preoxygenation: Pre-oxygenation with 100% oxygen ?Induction Type: IV induction ?Ventilation: Mask ventilation without difficulty ?Laryngoscope Size: McGraph and 4 ?Grade View: Grade I ?Tube type: Oral ?Number of attempts: 1 ?Airway Equipment and Method: Stylet and Video-laryngoscopy ?Placement Confirmation: ETT inserted through vocal cords under direct vision, positive ETCO2 and breath sounds checked- equal and bilateral ?Secured at: 22 cm ?Tube secured with: Tape ?Dental Injury: Teeth and Oropharynx as per pre-operative assessment  ? ? ? ? ?

## 2021-10-14 NOTE — Op Note (Signed)
?Operative Note   ? ?Name: Suzanne Copeland  ?Date of Service: 10/14/2021  ?DOB: 26-Nov-1985  ?MRN: [id number]  ? ?Pre-Operative Diagnosis:  ?1) Menorrhagia with irregular cycle [N92.1] ?2) Dysmenorrhea [N94.6] ?3) Endometrial polyp [N84.0] ? ?Post-Operative Diagnosis:  ?1) Menorrhagia with irregular cycle [N92.1] ?2) Dysmenorrhea [N94.6] ?3) Endometrial polyp [N84.0] ?4) Incidental cystotomy, repair intraoperatively by Urology ? ?Procedures:  ?1. Robot assisted Total Laparoscopic Hysterectomy, bilateral salpingectomy  ?2. Cystotomy Repair by Urology (Dr. Glori Luis, see separate note) ? ?Primary Surgeon: Prentice Docker, MD ?  ?EBL: 30 mL  ? ?IVF: 1,100 mL  ? ?Specimens: Uterus with cervix, bilateral ovaries ? ?Drains: Foley catheter ? ?Complications: accidental cystotomy with recognition and repair during surgery. Known risk of surgery, especially in the setting of history of cesarean delivery.  ? ?Disposition: PACU  ? ?Condition: Stable  ? ?Findings:  ?1)  Normal appearing uterus and cervix ?2) Bilateral fallopian tubes disrupted, consistent with history of tubal ligation.  Three Filshie clips located and removed. ?3) Approximately 2 cm (see Urology operative report) cystotomy after colpotomy. This was repaired by Urology. See separate operative report.  ? ?Procedure Summary:  ?The patient was taken to the operating room where general anesthesia was administered and found to be adequate. She was placed in the dorsal supine lithotomy position in [place] stirrups and prepped and draped in the usual sterile fashion. After a timeout was called an indwelling catheter was placed in her bladder.  A sterile speculum was placed in her vagina.  The anterior lip of the cervix was grasped with the single-tooth tenaculum.  The cervix was serially dilated to an 11 Pratt dilator.  The medium RUMI device was placed in accordance to the manufacturer's recommendations.  The tenaculum was also removed. The speculum was removed. ?  ?Attention  was turned to the abdomen where after injection of local anesthetic, an 8 mm infraumbilical incision was made with the scalpel. Entry into the abdomen was obtained via Optiview trocar technique (a blunt entry technique with camera visualization through the obturator upon entry). Verification of entry into the abdomen was obtained using opening pressures. The abdomen was insufflated with CO2. The camera was introduced through the trocar with verification of atraumatic entry.  Right and left abdominal entry sites were created after injection of local anesthetic about 8 cm lateral to the umbilical port in accordance with the Intuitive manufacturer's recommendations.  An additional port was placed 8 cm lateral to the right abdominal port with verification of clearance above the iliac crest by more than 2 cm.  The port sites were 8 mm.  The intuitive trochars were introduced under intra-abdominal camera visualization without difficulty ?  ?The XI robot was docked on the patient's left.  Clearance was verified from the patient's legs.  Through the umbilical port the camera was placed.  Through the port attached to arm 3 the monopolar scissors were placed.  Through the port attached to arm 4 the forced bipolar forceps were was placed.  The vessel sealer was attached to port 1. ?  ?After inspection of the abdomen and pelvis with the above-noted findings, the bilateral ureters were identified and found to be well away from the operative area of interest. The right fallopian tube was grasped at the fimbriated end and was transected using the vessel sealer along the mesosalpinx in a lateral to medial fashion. The fallopian tube segment was placed in the posterior cul-de-sac. The vessel sealer was used to transect the right round ligament and the utero-ovarian  ligament was transected. Tissue was divided along the right broad ligament to the level of the interior cervical os. There was a Filshie clip note to the right of the  bladder, mildly adherent to the peritoneum.  This was gently excised without difficutly and placed in the posterior cul-de-sac.  Adhesions from the anterior uterus were taken down with great care and the lower uterine segment was identified. Scar tissue and bladder tissue were dissected off the lower uterine segment and cervix without apparent difficulty. The right uterine artery was skeletonized and identified and after ligation was transected with the Vessel Sealer device. The same procedure was carried out on the left side. The colpotomy was performed using monopolar electrocautery in a circumferential fashion following the KOH ring.  The uterus and fallopian tubes and cervix were removed through the vagina. The Filshie clip was also removed. ? ?At this point, there was a notable defect in the bladder, as noted above.  Urology was called to assess.  ? ?Closure of the vaginal cuff was undertaken using the Stratfix stitch in a running fashion. All vascular pedicles were inspected and found to be hemostatic. This was accomplished while waiting for Urology.   ? ?Dr. Diamantina Providence entered the operating room at this time. He performed an inspection of the bladder to ensure no damage done to ureters.  He performed a repair of the cystotomy.  Please see his operative report for details. A catheter was left in place in her bladder per Urology. ?  ?All instruments removed from the robotic ports.  The robot was undocked from the patient.  The abdomen was then desufflated of CO2.  All trochars were then removed.  All skin incisions were closed using 4-0 Vicryl in a subcuticular fashion and reinforced using surgical skin glue. ?  ?The patient tolerated the procedure well.  Sponge, lap, needle, and instrument counts were correct x 2.  VTE prophylaxis: SCDs. Antibiotic prophylaxis: Ancef 2 grams IV prior to the start of surgery. She was awakened in the operating room and was taken to the PACU in stable condition.  ? ?Prentice Docker,  MD ?10/14/2021 10:48 AM  ? ?

## 2021-10-14 NOTE — Anesthesia Preprocedure Evaluation (Signed)
Anesthesia Evaluation  ?Patient identified by MRN, date of birth, ID band ?Patient awake ? ? ? ?Reviewed: ?Allergy & Precautions, H&P , NPO status , Patient's Chart, lab work & pertinent test results, reviewed documented beta blocker date and time  ? ?Airway ?Mallampati: II ? ?TM Distance: >3 FB ?Neck ROM: full ? ? ? Dental ? ?(+) Teeth Intact ?  ?Pulmonary ?neg pulmonary ROS,  ?  ?Pulmonary exam normal ? ? ? ? ? ? ? Cardiovascular ?Exercise Tolerance: Good ?negative cardio ROS ?Normal cardiovascular exam ?Rhythm:regular Rate:Normal ? ? ?  ?Neuro/Psych ? Headaches, negative psych ROS  ? GI/Hepatic ?negative GI ROS, Neg liver ROS,   ?Endo/Other  ?negative endocrine ROS ? Renal/GU ?negative Renal ROS  ?negative genitourinary ?  ?Musculoskeletal ? ? Abdominal ?  ?Peds ? 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    Comment:  2x ?05/28/2021: CHOLECYSTECTOMY; N/A ?    Comment:  Procedure: LAPAROSCOPIC CHOLECYSTECTOMY WITH  ?             INTRAOPERATIVE CHOLANGIOGRAM;  Surgeon: Hervey Ard  ?             W, MD;  Location: [facility] ORS;  Service: General;   ?             Laterality: N/A; ?04/2021: COLONOSCOPY ?04/2021: UPPER GI ENDOSCOPY ?BMI   ? Body Mass Index: 24.75 kg/m?  ?  ? Reproductive/Obstetrics ?negative OB ROS ? ?  ? ? ? ? ? ? ? ? ? ? ? ? ? ?  ?  ? ? ? ? ? ? ? ? ?Anesthesia Physical ?Anesthesia Plan ? ?ASA: 2 ? ?Anesthesia Plan: General ETT  ? ?Post-op Pain Management:   ? ?Induction:  ? ?PONV Risk Score and Plan: 4 or greater ? ?Airway Management Planned:  ? ?Additional Equipment:  ? ?Intra-op Plan:  ? ?Post-operative Plan:  ? ?Informed Consent: I have reviewed the patients History and Physical, chart, labs and discussed the procedure including the risks, benefits and alternatives for the  proposed anesthesia with the patient or authorized representative who has indicated his/her understanding and acceptance.  ? ? ? ?Dental Advisory Given ? ?Plan Discussed with: CRNA ? ?Anesthesia Plan Comments:   ? ? ? ? ? ? ?Anesthesia Quick Evaluation ? ?

## 2021-10-14 NOTE — Interval H&P Note (Signed)
History and Physical Interval Note: ? ?10/14/2021 ?7:25 AM ? ?Suzanne Copeland  has presented today for surgery, with the diagnosis of menorrhagia, dysmenorrhea, endometrial polyp.  The various methods of treatment have been discussed with the patient and family. After consideration of risks, benefits and other options for treatment, the patient has consented to  Procedure(s): ?XI ROBOTIC ASSISTED LAPAROSCOPIC HYSTERECTOMY AND SALPINGECTOMY (Bilateral) ?CYSTOSCOPY (N/A) as a surgical intervention.  The patient's history has been reviewed, patient examined, no change in status, stable for surgery.  I have reviewed the patient's chart and labs.  Questions were answered to the patient's satisfaction.   ? ?Prentice Docker, MD, FACOG ?[facility] ?10/14/2021 7:25 AM   ? ?

## 2021-10-14 NOTE — Op Note (Signed)
Date of procedure: 10/14/21 ? ?Preoperative diagnosis:  ?Bladder injury  ? ?Postoperative diagnosis:  ?Same  ? ?Procedure: ?Robotic cystorrhaphy ? ?Surgeon: Nickolas Madrid, MD ? ?Anesthesia: General ? ?Complications: None ? ?Intraoperative findings:  ?Consulted by GYN for 3 to 4 cm laceration of the posterior aspect of the bladder just above the trigone  ?Able to visualize ureteral orifices intact bilaterally with efflux of yellow urine ?2-layer watertight cystorrhaphy ? ?EBL: Minimal for urology portion ? ?Specimens: None for urology portion ? ?Drains: 62 French Foley ? ?Indication: Suzanne Copeland is a 36 y.o. patient undergoing robotic total laparoscopic hysterectomy by Dr. Glennon Mac with the gynecology service and a posterior bladder injury was made during dissection and urology was consulted intraoperatively.   ? ? ?Description of procedure: ? ?When I arrived in the room the robot was docked with excellent vision of the pelvis and Dr. Glennon Mac had just finished closing the vaginal cuff.  There was an obvious 3 to 4 cm defect horizontally in the posterior aspect of the bladder, and the Foley catheter could be visualized within.  I was able to look within the bladder and visualize both ureters with efflux of yellow urine. ? ?The catheter was deflated and pulled back into the urethra.  A running 3-0 V-Loc suture was used to close the mucosal layer from right to left.  Extreme care was taken when closing the mucosal layer to avoid the ureteral orifices.  At this point the catheter was readvanced, and the bladder was tested with 150 mL of methylene blue dyed saline, no leakage was noted.  A second running 3-0 V-Loc was used to reapproximate the detrusor muscle over the mucosal suture line.  I was careful not to take bites of too much tissue to avoid any tenting or obstruction of the ureters posteriorly.  A new 50 French Foley was gently advanced into the bladder, and the bladder was again tested with 150 mL of methylene  blue dyed saline with no evidence of leakage. ? ?The case was returned to the gynecology service. ? ?Disposition: Return to gynecology service ? ?Plan: ?Follow-up in urology clinic in 10 to 14 days with cystogram prior ? ?Nickolas Madrid, MD ? ?

## 2021-10-15 ENCOUNTER — Telehealth: Payer: Self-pay

## 2021-10-15 NOTE — Telephone Encounter (Signed)
Called pt, appt scheduled.

## 2021-10-15 NOTE — Telephone Encounter (Signed)
-----   Message from Billey Co, MD sent at 10/14/2021 11:48 AM EDT ----- ?Regarding: follow up ?This is the patient who is bladder I repaired today, we have actually seen her before. ? ?Can you please set her up to see me the week of May 8 through 11 with a x-ray cystogram prior(I can order the imaging).  I think they only do those in [place].  She could also have imaging done the day before in [place] and see me the next day in [place] if that works better ? ?She will be discharging from the hospital today, so may be touch base with her tomorrow, thanks much ? ?Nickolas Madrid, MD ?10/14/2021 ? ? ? ?

## 2021-10-18 LAB — SURGICAL PATHOLOGY

## 2021-10-18 NOTE — Anesthesia Postprocedure Evaluation (Signed)
Anesthesia Post Note ? ?Patient: Suzanne Copeland ? ?Procedure(s) Performed: XI ROBOTIC ASSISTED LAPAROSCOPIC HYSTERECTOMY AND SALPINGECTOMY (Bilateral) ?ROBOTIC ASSISTED BLADDER REPAIR ? ?Patient location during evaluation: PACU ?Anesthesia Type: General ?Level of consciousness: awake and alert ?Pain management: pain level controlled ?Vital Signs Assessment: post-procedure vital signs reviewed and stable ?Respiratory status: spontaneous breathing, nonlabored ventilation, respiratory function stable and patient connected to nasal cannula oxygen ?Cardiovascular status: blood pressure returned to baseline and stable ?Postop Assessment: no apparent nausea or vomiting ?Anesthetic complications: no ? ? ?No notable events documented. ? ? ?Last Vitals:  ?Vitals:  ? 10/14/21 1234 10/14/21 1326  ?BP: 114/78 98/61  ?Pulse: (!) 102 98  ?Resp: 15 14  ?Temp: (!) 36.3 ?C   ?SpO2: 100% 100%  ?  ?Last Pain:  ?Vitals:  ? 10/15/21 0925  ?TempSrc:   ?PainSc: 8   ? ? ?  ?  ?  ?  ?  ?  ? ?Molli Barrows ? ? ? ? ?

## 2021-10-22 ENCOUNTER — Other Ambulatory Visit: Payer: Self-pay

## 2021-10-22 ENCOUNTER — Other Ambulatory Visit: Payer: Self-pay | Admitting: Urology

## 2021-10-22 DIAGNOSIS — S3720XA Unspecified injury of bladder, initial encounter: Secondary | ICD-10-CM

## 2021-10-25 NOTE — Telephone Encounter (Signed)
Catheter looks good in picture, continue to drink plenty of fluids and keep follow up as scheduled for cystogram and foley removal. Likely healing from bladder repair ? ?Nickolas Madrid, MD ?10/25/2021 ? ?

## 2021-10-26 ENCOUNTER — Other Ambulatory Visit: Payer: Self-pay | Admitting: Urology

## 2021-10-26 ENCOUNTER — Ambulatory Visit
Admission: RE | Admit: 2021-10-26 | Discharge: 2021-10-26 | Disposition: A | Discharge status: Home / Self Care | Payer: Managed Care, Other (non HMO) | Source: Ambulatory Visit | Attending: Urology | Admitting: Urology

## 2021-10-26 ENCOUNTER — Encounter: Payer: Self-pay | Admitting: Radiology

## 2021-10-26 DIAGNOSIS — S3729XA Other injury of bladder, initial encounter: Secondary | ICD-10-CM | POA: Insufficient documentation

## 2021-10-26 DIAGNOSIS — S3720XD Unspecified injury of bladder, subsequent encounter: Secondary | ICD-10-CM

## 2021-10-26 DIAGNOSIS — Y838 Other surgical procedures as the cause of abnormal reaction of the patient, or of later complication, without mention of misadventure at the time of the procedure: Secondary | ICD-10-CM | POA: Insufficient documentation

## 2021-10-26 DIAGNOSIS — S3720XA Unspecified injury of bladder, initial encounter: Secondary | ICD-10-CM

## 2021-10-26 MED ORDER — IOTHALAMATE MEGLUMINE 17.2 % UR SOLN
250.0000 mL | Freq: Once | URETHRAL | Status: AC | PRN
  Administered 2021-10-26: 200 mL via INTRAVESICAL

## 2021-10-27 ENCOUNTER — Ambulatory Visit (INDEPENDENT_AMBULATORY_CARE_PROVIDER_SITE_OTHER): Payer: Managed Care, Other (non HMO) | Admitting: Urology

## 2021-10-27 ENCOUNTER — Encounter: Payer: Self-pay | Admitting: Urology

## 2021-10-27 VITALS — BP 118/77 | HR 105 | Ht 61.0 in | Wt 132.0 lb

## 2021-10-27 DIAGNOSIS — N3281 Overactive bladder: Secondary | ICD-10-CM

## 2021-10-27 DIAGNOSIS — S3720XD Unspecified injury of bladder, subsequent encounter: Secondary | ICD-10-CM

## 2021-10-27 MED ORDER — SULFAMETHOXAZOLE-TRIMETHOPRIM 800-160 MG PO TABS
1.0000 | ORAL_TABLET | Freq: Once | ORAL | Status: AC
  Administered 2021-10-27: 1 via ORAL

## 2021-10-27 MED ORDER — SULFAMETHOXAZOLE-TRIMETHOPRIM 800-160 MG PO TABS: 1.0000 | ORAL_TABLET | Freq: Two times a day (BID) | ORAL | 0 refills | Status: AC

## 2021-10-27 NOTE — Addendum Note (Signed)
Addended by: Despina Hidden on: 10/27/2021 09:00 AM ? ? Modules accepted: Orders ? ?

## 2021-10-27 NOTE — Progress Notes (Signed)
? ?  10/27/2021 ?8:19 AM  ? ?Suzanne Copeland ?10/27/1985 ?[id number] ? ?Reason for visit: Follow up iatrogenic bladder injury and repair, Overactive bladder ? ?HPI: ?36 year old female who I actually saw in November 2022 for chronic abdominal pain and left renal cysts and overactive bladder bladder symptoms.  At that time she had multiple CT scans with parapelvic cyst but no hydronephrosis, urinalysis was benign, and she had mild overactive symptoms secondary to high intake of soda.  She deferred any bladder medications at that time. ? ?She underwent a robotic total hysterectomy and bilateral salpingectomy by Dr. Glennon Mac on 10/14/2021 and an iatrogenic 3 to 4 cm laceration of the posterior aspect of the bladder just above the trigone was made intraoperatively.  I was consulted intraoperatively, and repaired this in 2 layers.  Closure was watertight, and I was able to visualize the ureteral orifices within the bladder which were away from the laceration.  She has not tolerated the catheter well.  Urine is clear yellow in the Foley catheter today. ? ?I personally viewed and interpreted the cystogram and CT pelvis performed yesterday that shows no evidence of bladder leak.  Radiologist was unsure about the initial cystogram which prompted the CT scan. ? ?Bactrim was given for prophylaxis today, and Foley catheter was removed.  We will send urine for culture as well as give 3 days of Bactrim to sterilize the urine. ? ?We discussed expectations over the next few days and weeks as her bladder heals, and I anticipate she may have some urgency, frequency, and urge incontinence as things continue to heal.  I recommended avoiding bladder irritants.  Return precautions were discussed.  She can follow-up in 6 to 8 weeks to check on her urinary symptoms.  Could consider a trial of Myrbetriq at that time if persistently bothersome overactive symptoms, would avoid anticholinergics with her chronic constipation. ? ?Bactrim DS twice daily  x3 days to sterilize urine ?Return precautions discussed at length ?RTC 6 to 8 weeks with PA for symptom check regarding bladder symptoms ? ? ?Billey Co, MD ? ?[facility] ?[address], [address] ?[place], [place] [address] ?[phone] ? ? ?

## 2021-10-29 LAB — CULTURE, URINE COMPREHENSIVE

## 2021-11-09 ENCOUNTER — Telehealth: Payer: Self-pay | Admitting: Neurology

## 2021-11-09 NOTE — Telephone Encounter (Signed)
LVM and sent mychart msg informing pt of r/s needed for 7/10- Dr. Jaynee Eagles out.

## 2021-12-20 ENCOUNTER — Ambulatory Visit: Payer: Managed Care, Other (non HMO) | Admitting: Neurology

## 2021-12-23 ENCOUNTER — Ambulatory Visit: Payer: Managed Care, Other (non HMO) | Admitting: Physician Assistant

## 2021-12-26 ENCOUNTER — Encounter: Payer: Self-pay | Admitting: Neurology

## 2021-12-27 ENCOUNTER — Ambulatory Visit: Payer: Managed Care, Other (non HMO) | Admitting: Neurology

## 2021-12-29 MED ORDER — AJOVY 225 MG/1.5ML ~~LOC~~ SOAJ: 225.0000 mg | SUBCUTANEOUS | 1 refills | Status: AC

## 2021-12-30 ENCOUNTER — Telehealth: Payer: Self-pay | Admitting: *Deleted

## 2021-12-30 NOTE — Telephone Encounter (Signed)
Completed Ajovy PA on Cover My Meds Key: [id number] and faxed office note to Cover My Meds. Awaiting determination from [facility].

## 2022-01-03 NOTE — Telephone Encounter (Addendum)
You do not meet the clinical requirements for this medication. REASON: This decision is based on your plan's drug coverage policy for this medication. Please see the DECISION NOTES AND DETAILS section for more information. DRUG NAME: Ajovy Inj 225/1.5 Consuello Closs PATIENT INFO: Member ID: [id number] The denial was based on our criteria for Ajovy Inj 225/1.5.  Per your health plan's criteria, this drug is covered if you meet the following: (1) Medication overuse headache has been considered and you have stopped drugs that can possibly cause this type of headache (potentially offending drugs have been discontinued). (2) You have greater than or equal to 15 headache days per month, of which at least 8 must be migraine days for at least 3 months. (3) You are not taking this drug with another calcitonin gene-related peptide inhibitor for preventive treatment of your condition. The information provided does not show that you meet the criteria listed above. Please speak with your doctor about your choices. Reviewed by: [email or web address], R.Ph.

## 2022-01-03 NOTE — Telephone Encounter (Signed)
I called [employer],  spoke to [place].  I relayed that the decision questions that were noted it seems that she would have been approved.  I was able to do another PA for AJOVY.  It was approved until 07-06-2022.  REF # [id number].

## 2022-04-06 ENCOUNTER — Encounter: Payer: Self-pay | Admitting: Neurology

## 2022-04-11 NOTE — Telephone Encounter (Signed)
Pt scheduled for follow up over the phone with Dr. Jaynee Eagles for 04/12/22 at 11:00am

## 2022-04-12 ENCOUNTER — Ambulatory Visit (INDEPENDENT_AMBULATORY_CARE_PROVIDER_SITE_OTHER): Payer: Managed Care, Other (non HMO) | Admitting: Neurology

## 2022-04-12 ENCOUNTER — Telehealth: Payer: Self-pay | Admitting: *Deleted

## 2022-04-12 ENCOUNTER — Telehealth: Payer: Self-pay | Admitting: Neurology

## 2022-04-12 ENCOUNTER — Encounter: Payer: Self-pay | Admitting: Neurology

## 2022-04-12 VITALS — BP 120/72 | HR 89 | Ht 61.0 in | Wt 146.0 lb

## 2022-04-12 DIAGNOSIS — G43711 Chronic migraine without aura, intractable, with status migrainosus: Secondary | ICD-10-CM | POA: Diagnosis not present

## 2022-04-12 MED ORDER — CANDESARTAN CILEXETIL 4 MG PO TABS: 4.0000 mg | ORAL_TABLET | Freq: Every day | ORAL | 6 refills | Status: AC

## 2022-04-12 MED ORDER — KETOROLAC TROMETHAMINE 60 MG/2ML IM SOLN
60.0000 mg | Freq: Once | INTRAMUSCULAR | Status: AC
  Administered 2022-04-12: 60 mg via INTRAMUSCULAR

## 2022-04-12 NOTE — Progress Notes (Signed)
[facility]    Provider:  Dr Jaynee Eagles Requesting Provider: No ref. provider found Primary Care Provider:  Pcp, No  CC:  migraine  Interval history 04/12/2022: She has daily continuous migraines for the last 3 months can't get them to stop. [facility] never got the vyepti approved. She has been on [employer], Aimovig and Ajovy.  Patient is here and reports the Emgality has not been helping she has been on it at least 5 months, she has tried all 3 injectables, we talked about Vyepti which would be her first choice, Lenoria Chime would be her second choice however that is only approved for episodic migraines and she has chronic migraines.  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 She has been to multiple neurologists and I think she should be at an academic center.  Patient complains of symptoms per HPI as well as the following symptoms: spinning . Pertinent negatives and positives per HPI. All others negative   HPI:  Suzanne Copeland is a 36 y.o. female here as requested by No ref. 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  Of note, PATIENT ALREADY SEES [facility] AND IS ON MULTIPLE MIGRAINE MEDICATIONS including Vyepti(she denies they were able to get it approved and she does not feel [facility] is helping her with migraines so she would like to transition here).  I reviewed Dr. 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  Dr. Hartford Poli saw patient last July 22, 2021 in consultation at the request of Dr. Cornelia Copa regarding a pituitary microadenoma.  Patient had an MRI in 2009 to evaluate for chronic migraines which incidentally found a pituitary microadenoma.  She was referred to [facility] endocrinology for evaluation after expressing straw-colored thin fluid from her nipples, MRI in March 2022 noted a 2 x 2 millimeter pituitary microadenoma, labs in March 2022 found normal prolactin 12 and other hormones normal, since moving to [place] she continues to have severe frequent migraines, repeat MRI in October 2022 noted her pituitary microadenoma essentially unchanged at 3.1 x 1.4 x 2.8 mm.  She is concerned that this is the cause of her migraines and other symptoms.  She is on Flexeril, Vyepti, tizanidine, topiramate, trazodone, Ubrelvy.  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  Prolactin was again normal collected July 22, 2021.  She has had migraines since she was little, they found a pituitary tumor years ago, her migraines are severe, she forgets things, she can't get her words out, affecting her work. They can be unilateral either side, skin is sensitive to the touch, pressure, pulsating/pounding/throbbing, light and sound sensitivity, nausea, smells can trigger a migraine, currently on day 14 of a migraines, radiates into the neck, ear pain but went to see ED and was tested. Over the last  6 months she has missed multiple days of work, 20 headache days a month, 10 moderate to severe migraine days a month, they usually 2 days but this migraine has been ongoing 14 days. Preventative is topiramate, Roselyn Meier is her acute management medication, tried magnesium. Denies stress or depression. No medication overuse, no aura, Her tubes  are tied, not planning on having kids, discussed not getting pregnant on Emgality for 6 months after starting. No other focal neurologic deficits, associated symptoms, inciting events or modifiable factors.   Reviewed notes, labs and imaging from outside physicians, which showed:   From a thorough review of records, medications tried that can be used In migraine management include: Meds tried:  topiramate(since 2019), blood pressure medications contraindicated gave her hypotension but has tried verapamil and propranolol in the past , amitriptyline and nortriptyline (side effects), aimovig(tried 4 months stopped working), ajovy, Teaching laboratory technician,  imitrex PO and injections, rizatriptan, effexor(makes her sick),  Cymbalta. Tried botox for a year (she has travelled around [place] and came to [place] last year but has seen multiple neurologists in the past), nurtec, ubrelvy, candesartan, depakote  MRI brain 04/02/2021: reviewed report, do not have access to images  FINDINGS: The sella turcica is normal in size without enlargement. The pituitary gland is normal in size. There is a 3.1 x 1.4 x 2.8 mm abnormality in the right superior aspect of the pituitary gland. This demonstrates increased signal intensity on T1-weighted images and demonstrates no enhancement on the postcontrast images. This could represent a cyst or small pituitary microadenoma. By description, this lesion measured 2 x 2 mm on the previous study in 2020. It may be slightly enlarged. The remainder of the pituitary gland is normal in appearance with homogeneous signal intensity and homogeneous enhancement. No parasellar abnormality noted.   Whole brain images demonstrate the ventricles to be normal without dilatation, mass effect, or midline shift. No abnormal enhancement in the brain parenchyma.  Review of Systems: Patient complains of symptoms per HPI as well as the following symptoms intractable headahc. Pertinent negatives and positives per  HPI. All others negative.   Social History   Socioeconomic History   Marital status: Single    Spouse name: Not on file   Number of children: 2   Years of education: Not on file   Highest education level: Not on file  Occupational History   Not on file  Tobacco Use   Smoking status: Never   Smokeless tobacco: Never  Vaping Use   Vaping Use: Never used  Substance and Sexual Activity   Alcohol use: Not Currently    Alcohol/week: 1.0 standard drink of alcohol    Types: 1 Standard drinks or equivalent per week    Comment: rarely   Drug use: Not Currently   Sexual activity: Not on file  Other Topics Concern   Not on file  Social History Narrative   Caffeine occas to help with headaches   Social Determinants of Health   Financial Resource Strain: Not on file  Food Insecurity: Not on file  Transportation Needs: Not on file  Physical Activity: Not on file  Stress: Not on file  Social Connections: Not on file  Intimate Partner Violence: Not on file    Family History  Problem Relation Age of Onset   Stroke Mother    Hypertension Mother    Diabetes Mother     Past Medical History:  Diagnosis Date   Migraine    Pituitary microadenoma ([place])  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[facility])     Patient Active Problem List   Diagnosis Date Noted   Menorrhagia with irregular cycle 10/14/2021   Dysmenorrhea 10/14/2021   Endometrial polyp 10/14/2021   Chronic migraine without aura, with intractable migraine, so stated, with status migrainosus 08/22/2021    Past Surgical History:  Procedure Laterality Date   BLADDER REPAIR  10/14/2021   Procedure: ROBOTIC ASSISTED BLADDER REPAIR;  Surgeon: Billey Co, MD;  Location: [facility] ORS;  Service: Urology;;   CESAREAN SECTION     2x   CHOLECYSTECTOMY N/A 05/28/2021   Procedure: LAPAROSCOPIC CHOLECYSTECTOMY WITH INTRAOPERATIVE CHOLANGIOGRAM;  Surgeon: Robert Bellow, MD;  Location: [facility] ORS;  Service: General;  Laterality: N/A;   COLONOSCOPY   04/2021   ROBOTIC ASSISTED LAPAROSCOPIC HYSTERECTOMY AND SALPINGECTOMY Bilateral 10/14/2021   Procedure: XI ROBOTIC ASSISTED LAPAROSCOPIC HYSTERECTOMY AND SALPINGECTOMY;  Surgeon: Will Bonnet, MD;  Location: [facility] ORS;  Service: Gynecology;  Laterality: Bilateral;   UPPER GI ENDOSCOPY  04/2021    Current Outpatient Medications  Medication Sig Dispense Refill   candesartan (ATACAND) 4 MG tablet Take 1 tablet (4 mg total) by mouth daily. 30 tablet 6   cyclobenzaprine (FLEXERIL) 10 MG tablet Take 10 mg by mouth 2 (two) times daily as needed for muscle spasms.     pantoprazole (PROTONIX) 40 MG tablet Take 40 mg by mouth in the morning and at bedtime.     Ubrogepant (UBRELVY) 50 MG TABS Take 50 mg by mouth daily as needed (migraines).     Fremanezumab-vfrm (AJOVY) 225 MG/1.5ML SOAJ Inject 225 mg into the skin every 30 (thirty) days. (Patient not taking: Reported on 04/12/2022) 1.5 mL 1   oxybutynin (DITROPAN-XL) 10 MG 24 hr tablet Take 1 tablet (10 mg total) by mouth at bedtime. 20 tablet 0   traZODone (DESYREL) 50 MG tablet Take 50 mg by mouth at bedtime. (Patient not taking: Reported on 04/12/2022)     No current facility-administered medications for this visit.    Allergies as of 04/12/2022 - Review Complete 04/12/2022  Allergen Reaction Noted   Ciprofloxacin Hives and Rash 08/22/2018    Vitals: BP 120/72 (BP Location: Right Arm, Patient Position: Sitting)   Pulse 89   Ht '5\' 1"'$  (1.549 m)   Wt 146 lb (66.2 kg)   LMP 07/20/2021   BMI 27.59 kg/m  Last Weight:  Wt Readings from Last 1 Encounters:  04/12/22 146 lb (66.2 kg)   Last Height:   Ht Readings from Last 1 Encounters:  04/12/22 '5\' 1"'$  (1.549 m)   Exam: NAD, pleasant, she is wearing dark glasses                  Speech:    Speech is normal; fluent and spontaneous with normal comprehension.  Cognition:    The patient is oriented to person, place, and time;     recent and remote memory intact;     language fluent;     Cranial Nerves:    The pupils are equal, round, and reactive to light.Trigeminal sensation is intact and the muscles of mastication are normal. The face is symmetric. The palate elevates in the midline. Hearing intact. Voice is normal. Shoulder shrug is normal. The tongue has normal motion without fasciculations.   Coordination:  No dysmetria  Motor Observation:    No asymmetry, no atrophy, and no involuntary movements noted. Tone:    Normal muscle tone.     Strength:    Strength is V/V in the upper  and lower limbs.      Sensation: intact to LT     Assessment/Plan:   36 y.o. 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  Of note, PATIENT ALREADY SEES [facility] AND IS ON MULTIPLE MIGRAINE MEDICATIONS including Vyepti(says they did not get it approved), topiramate, ubrelvy, effexor but states she is not happy with their care and she would like to transition to [place].  Reassured her that, as Dr. Hartford Poli also stated, her pituitary microadenoma is not the cause for her headaches and perceived memory loss.  We had a long talk about migraine management, current options for preventative and acute management, she has not tried Ajovy or Emgality I think that would be a great place to start.  She has had multiple MRIs of the brain even recently.  They never got the vyepti approved. She has been on [employer], Aimovig and Ajovy.  Patient is here and reports the Emgality has not been helping she has been on it at least 5 months, she has tried all 3 injectables, we talked about Vyepti which would be her first choice, Lenoria Chime would be her second choice however that is only approved for episodic migraines and she has chronic migraines.  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 She has been to multiple neurologists and I think she should be at an academic center.  Start Vyepti and send her to [facility] formal headache clinic, she has been to many neurologists and nothing getting gbetter I believe she needs an academic center. Can also try Candesartan discussed blood pressure, nerve tried that also memantine may be a good trial due to failing so many medications.   From a thorough review of records, medications tried that can be used In migraine management include: Meds tried:  topiramate(since 2019), blood pressure medications contraindicated gave her hypotension but has tried verapamil and propranolol in the past , amitriptyline and nortriptyline (side effects), aimovig(tried 4 months stopped working), ajovy, Teaching laboratory technician,  imitrex PO and injections, rizatriptan, effexor(makes her sick),  Cymbalta. Tried botox for a year (she has travelled around [place] and came to [place] last year but has seen multiple neurologists in the past), nurtec, ubrelvy, candesartan, depakote  Discussed: To prevent or relieve headaches, try the following: Cool Compress. Lie down and place a cool compress on your head.  Avoid headache triggers. If certain foods or odors seem to have triggered your migraines in the past, avoid them. A headache diary might help you identify triggers.  Include physical activity in your daily routine. Try a daily walk or other moderate aerobic exercise.  Manage stress. Find healthy ways to cope with the stressors, such as delegating tasks on your to-do list.  Practice relaxation techniques. Try deep breathing, yoga, massage and visualization.  Eat regularly. Eating  regularly scheduled meals and maintaining a healthy diet might help prevent headaches. Also, drink plenty of fluids.  Follow a regular sleep  schedule. Sleep deprivation might contribute to headaches Consider biofeedback. With this mind-body technique, you learn to control certain bodily functions -- such as muscle tension, heart rate and blood pressure -- to prevent headaches or reduce headache pain.    Proceed to emergency room if you experience new or worsening symptoms or symptoms do not resolve, if you have new neurologic symptoms or if headache is severe, or for any concerning symptom.   Provided education and documentation from American headache Society toolbox including articles on: chronic migraine medication overuse headache, chronic migraines, prevention of migraines, behavioral and other nonpharmacologic treatments for headache.   Orders Placed This Encounter  Procedures   Ambulatory referral to Neurology   Meds ordered this encounter  Medications   candesartan (ATACAND) 4 MG tablet    Sig: Take 1 tablet (4 mg total) by mouth daily.    Dispense:  30 tablet    Refill:  6   ketorolac (TORADOL) injection 60 mg    Lot [id number] exp 01/2024  ndc 45409-811-91    Cc: No ref. provider found,  Pcp, No  Sarina Ill, MD  [facility] [address] [place], [place] [address]  Phone [phone] Fax [phone]  I spent 40 minutes of face-to-face and non-face-to-face time with patient on the  1. Chronic migraine without aura, with intractable migraine, so stated, with status migrainosus    diagnosis.  This included previsit chart review, lab review, study review, order entry, electronic health record documentation, patient education on the different diagnostic and therapeutic options, counseling and coordination of care, risks and benefits of management, compliance, or risk factor reduction

## 2022-04-12 NOTE — Telephone Encounter (Signed)
Vyepti 100 mg IV x 1 followed by 300 mg IV ordered, signed by provider, and given to [employer] with [facility].

## 2022-04-12 NOTE — Patient Instructions (Signed)
Start vyepti Start Candesartan Referral to [facility]  Eptinezumab Injection What is this medication? EPTINEZUMAB (EP ti NEZ ue mab) prevents migraines. It works by blocking a substance in the body that causes migraines. It is a monoclonal antibody. This medicine may be used for other purposes; ask your health care provider or pharmacist if you have questions. COMMON BRAND NAME(S): Vyepti What should I tell my care team before I take this medication? They need to know if you have any of these conditions: An unusual or allergic reaction to eptinezumab, other medications, foods, dyes, or preservatives Pregnant or trying to get pregnant Breast-feeding How should I use this medication? This medication is injected into a vein. It is given by your care team in a hospital or clinic setting. Talk to your care team about the use of this medication in children. Special care may be needed. Overdosage: If you think you have taken too much of this medicine contact a poison control center or emergency room at once. NOTE: This medicine is only for you. Do not share this medicine with others. What if I miss a dose? Keep appointments for follow-up doses. It is important not to miss your dose. Call your care team if you are unable to keep an appointment. What may interact with this medication? Interactions are not expected. This list may not describe all possible interactions. Give your health care provider a list of all the medicines, herbs, non-prescription drugs, or dietary supplements you use. Also tell them if you smoke, drink alcohol, or use illegal drugs. Some items may interact with your medicine. What should I watch for while using this medication? Your condition will be monitored carefully while you are receiving this medication. What side effects may I notice from receiving this medication? Side effects that you should report to your care team as soon as possible: Allergic  reactions or angioedema--skin rash, itching or hives, swelling of the face, eyes, lips, tongue, arms, or legs, trouble swallowing or breathing Side effects that usually do not require medical attention (report to your care team if they continue or are bothersome): Runny or stuffy nose This list may not describe all possible side effects. Call your doctor for medical advice about side effects. You may report side effects to FDA at 1-800-FDA-1088. Where should I keep my medication? This medication is given in a hospital or clinic. It will not be stored at home. NOTE: This sheet is a summary. It may not cover all possible information. If you have questions about this medicine, talk to your doctor, pharmacist, or health care provider.  2023 Elsevier/Gold Standard (2021-07-26 00:00:00) Candesartan Tablets What is this medication? CANDESARTAN (kan des AR tan) treats high blood pressure and heart failure. It works by relaxing blood vessels, which decreases the amount of work the heart has to do. It belongs to a group of medications called ARBs. This medicine may be used for other purposes; ask your health care provider or pharmacist if you have questions. COMMON BRAND NAME(S): Atacand What should I tell my care team before I take this medication? They need to know if you have any of these conditions: Heart failure If you are on a special diet, such as a low salt diet Kidney or liver disease An unusual or allergic reaction to candesartan, other medications, foods, dyes, or preservatives Pregnant or trying to get pregnant Breast-feeding How should I use this medication? Take this medication by mouth. Take it as directed on the prescription label at  the same time every day. You can take it with or without food. If it upsets your stomach, take it with food. Keep taking it unless your care team tells you to stop. Talk to your care team about the use of this medication in children. While it may be  prescribed for children as young as 1 for selected conditions, precautions do apply. Overdosage: If you think you have taken too much of this medicine contact a poison control center or emergency room at once. NOTE: This medicine is only for you. Do not share this medicine with others. What if I miss a dose? If you miss a dose, take it as soon as you can. If it is almost time for your next dose, take only that dose. Do not take double or extra doses. What may interact with this medication? This medication may interact with the following: Blood pressure medications Diuretics, especially triamterene, spironolactone, or amiloride Lithium NSAIDs, medications for pain and inflammation, like ibuprofen or naproxen Potassium salts or potassium supplements This list may not describe all possible interactions. Give your health care provider a list of all the medicines, herbs, non-prescription drugs, or dietary supplements you use. Also tell them if you smoke, drink alcohol, or use illegal drugs. Some items may interact with your medicine. What should I watch for while using this medication? Visit your care team for regular checks on your progress. Check your blood pressure as directed. Ask your care team what your blood pressure should be and when you should contact them. Call your care team if you notice an irregular or fast heartbeat. Women should inform their care team if they wish to become pregnant or think they might be pregnant. There is a potential for serious side effects to an unborn child, particularly in the second or third trimester. Talk to your care team or pharmacist for more information. You may get drowsy or dizzy. Do not drive, use machinery, or do anything that needs mental alertness until you know how this medication affects you. Do not stand or sit up quickly, especially if you are an older patient. This reduces the risk of dizzy or fainting spells. Avoid salt substitutes unless you are  told otherwise by your care team. Do not treat yourself for coughs, colds, or pain while you are taking this medication without asking your care team for advice. Some ingredients may increase your blood pressure. What side effects may I notice from receiving this medication? Side effects that you should report to your care team as soon as possible: Allergic reactions--skin rash, itching, hives, swelling of the face, lips, tongue, or throat High potassium level--muscle weakness, fast or irregular heartbeat Kidney injury--decrease in the amount of urine, swelling of the ankles, hands, or feet Low blood pressure--dizziness, feeling faint or lightheaded, blurry vision Side effects that usually do not require medical attention (report to your care team if they continue or are bothersome): Back pain Dizziness Fatigue Headache Runny or stuffy nose Sore throat This list may not describe all possible side effects. Call your doctor for medical advice about side effects. You may report side effects to FDA at 1-800-FDA-1088. Where should I keep my medication? Keep out of the reach of children and pets. Store at room temperature below 30 degrees C (86 degrees F). Do not freeze. Throw away any unused medication after the expiration date. NOTE: This sheet is a summary. It may not cover all possible information. If you have questions about this medicine, talk to your  doctor, pharmacist, or health care provider.  2023 Elsevier/Gold Standard (2021-05-19 00:00:00)

## 2022-04-12 NOTE — Telephone Encounter (Signed)
-----   Message from Melvenia Beam, MD sent at 04/12/2022 11:56 AM EDT ----- Regarding: Start Vyepti Please start vyepti protocol. First injecton '100mg'$  then increase to '300mg'$ . G43.711   From a thorough review of records, medications tried that can be used In migraine management include: Meds tried:  topiramate(since 2019), blood pressure medications contraindicated gave her hypotension but has tried verapamil and propranolol in the past , amitriptyline and nortriptyline (side effects), aimovig(tried 4 months stopped working), ajovy, Teaching laboratory technician,  imitrex PO and injections, rizatriptan, effexor(makes her sick),  Cymbalta. Tried botox for a year (she has travelled around [place] and came to [place] last year but has seen multiple neurologists in the past), nurtec, ubrelvy, candesartan

## 2022-04-12 NOTE — Progress Notes (Signed)
Pt here for  appt. Verbal order per Dr. Jaynee Eagles for toradol '60mg'$  IM injection.  Under aseptic technique toradol '60mg'$  IM given   R upper outer gluteal .  Did sting as pt anticipated but she did tolerated.  Bandaid applied.

## 2022-04-12 NOTE — Telephone Encounter (Signed)
Referral sent to [facility], phone # [phone].

## 2022-04-25 DIAGNOSIS — Z0289 Encounter for other administrative examinations: Secondary | ICD-10-CM

## 2022-04-26 ENCOUNTER — Telehealth: Payer: Self-pay | Admitting: *Deleted

## 2022-04-26 NOTE — Telephone Encounter (Signed)
FMLA form signed by Dr Jaynee Eagles and sent to medical records for processing.

## 2022-04-26 NOTE — Telephone Encounter (Signed)
Completed FMLA form and it is pending Dr Cathren Laine signature.

## 2022-05-04 NOTE — Telephone Encounter (Signed)
I reached out to Suzanne Copeland w/ [employer].

## 2022-08-15 ENCOUNTER — Encounter: Payer: Self-pay | Admitting: Neurology

## 2022-08-22 ENCOUNTER — Telehealth: Payer: Self-pay | Admitting: Adult Health

## 2022-08-22 NOTE — Telephone Encounter (Signed)
LVM and sent mychart msg informing pt of r/s needed for 3/5 appt- NP out.

## 2022-08-23 ENCOUNTER — Ambulatory Visit: Payer: Managed Care, Other (non HMO) | Admitting: Adult Health

## 2022-08-31 ENCOUNTER — Telehealth: Payer: Self-pay | Admitting: Neurology

## 2022-08-31 NOTE — Telephone Encounter (Signed)
Suzanne Copeland is calling from Suzanne Copeland stated she has been trying to reach someone in office since Jan 8th. This pt has [facility] and has a authorization til 4/29 this pt is out of network and will not have authorization after this, if you would like to continue this medication someone needs to call [phone] ext [phone]. Stated she have left her number several times and if she doesn't hear back she will be closing the case.

## 2022-08-31 NOTE — Telephone Encounter (Signed)
Ok [facility] handles the PAs for this. I do not see any calls documented in pt's chart here at [facility] for this so I have printed this call and given to [facility] w/ [facility] to look into this asap.

## 2022-10-11 ENCOUNTER — Encounter: Payer: Self-pay | Admitting: Neurology

## 2022-12-12 IMAGING — DX DG CHEST 2V
2 series · 2 of 2 positions shown · non-contrast
Comparison: None.

CLINICAL DATA: Cough and congestion for 9 days

EXAM:
CHEST - 2 VIEW

[chest pa]
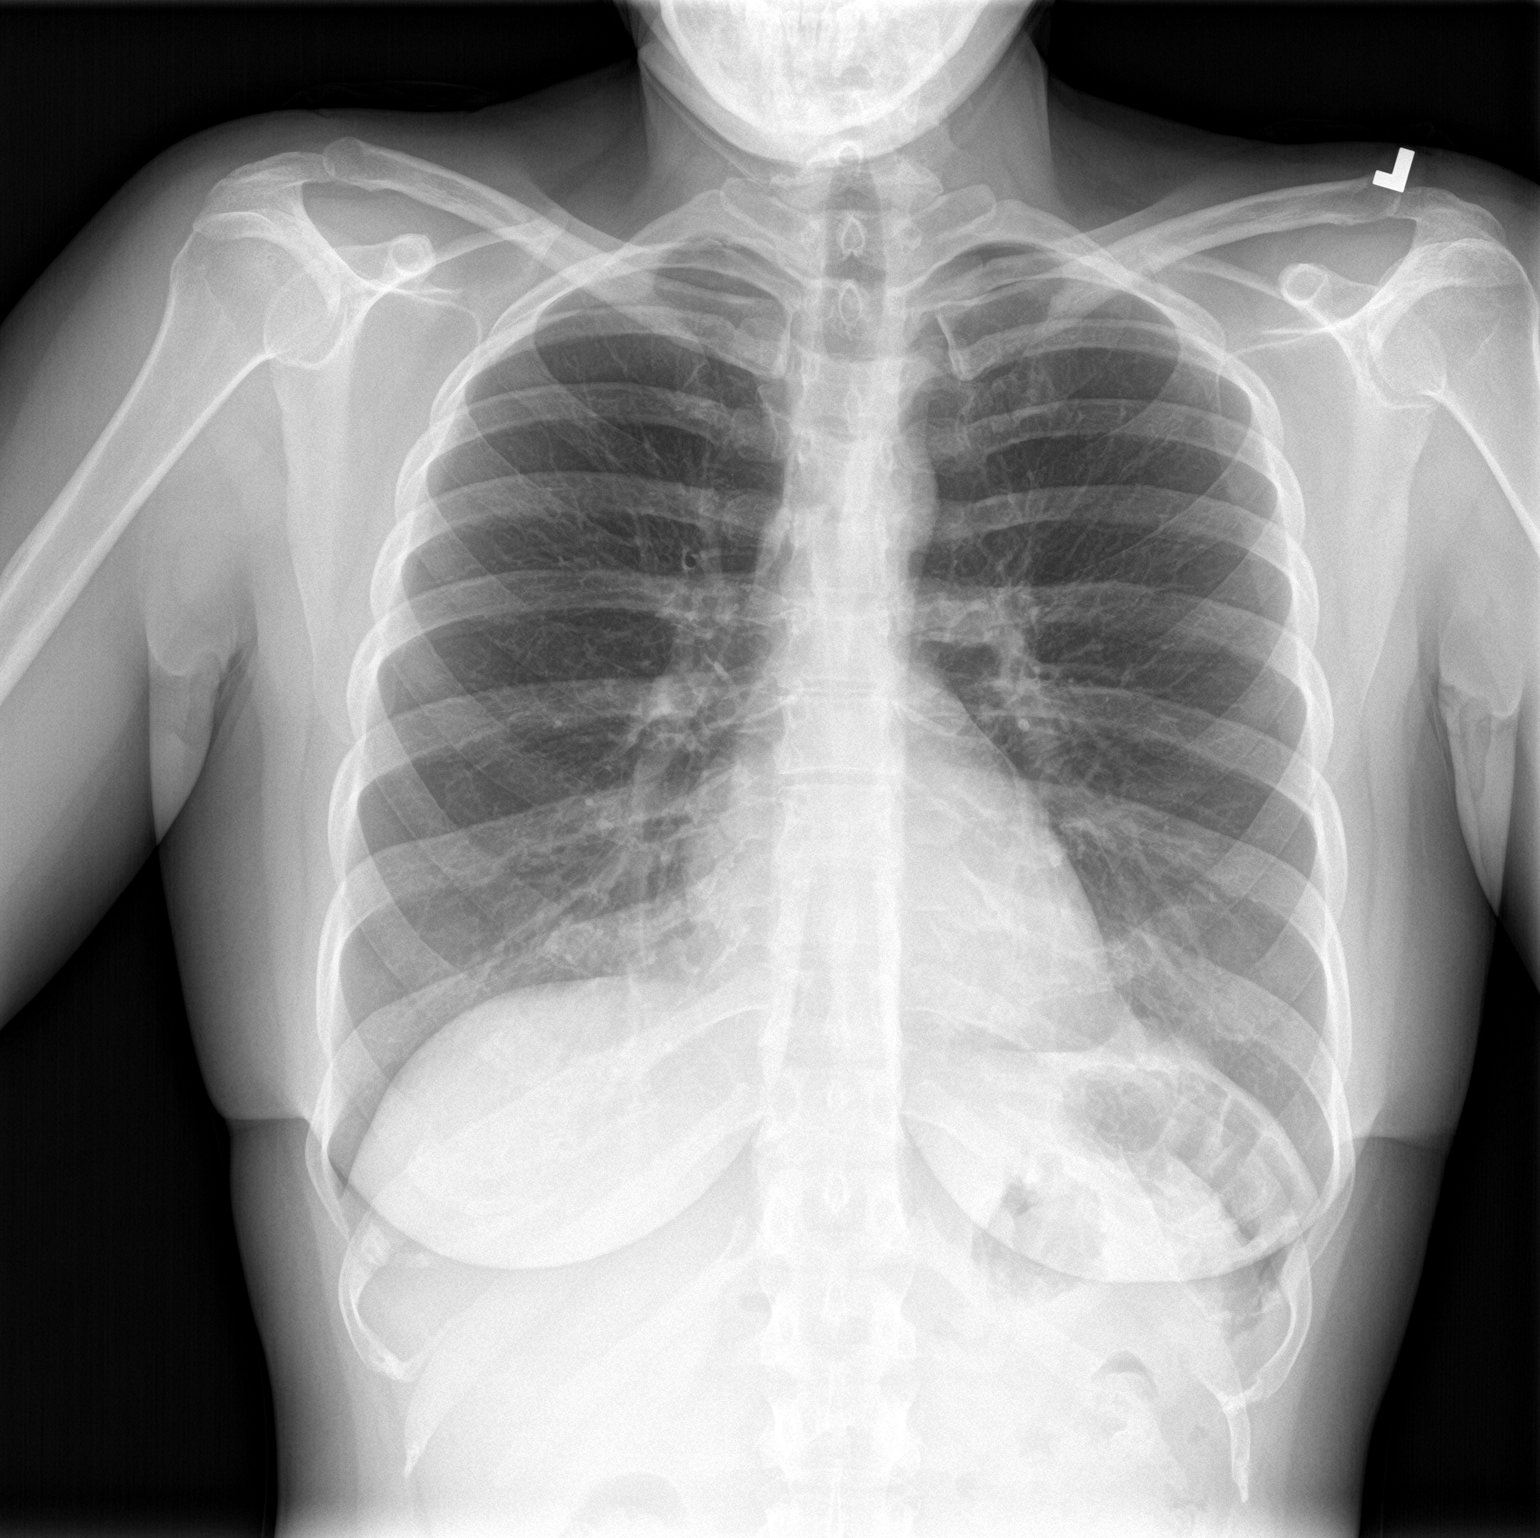

[chest lat]
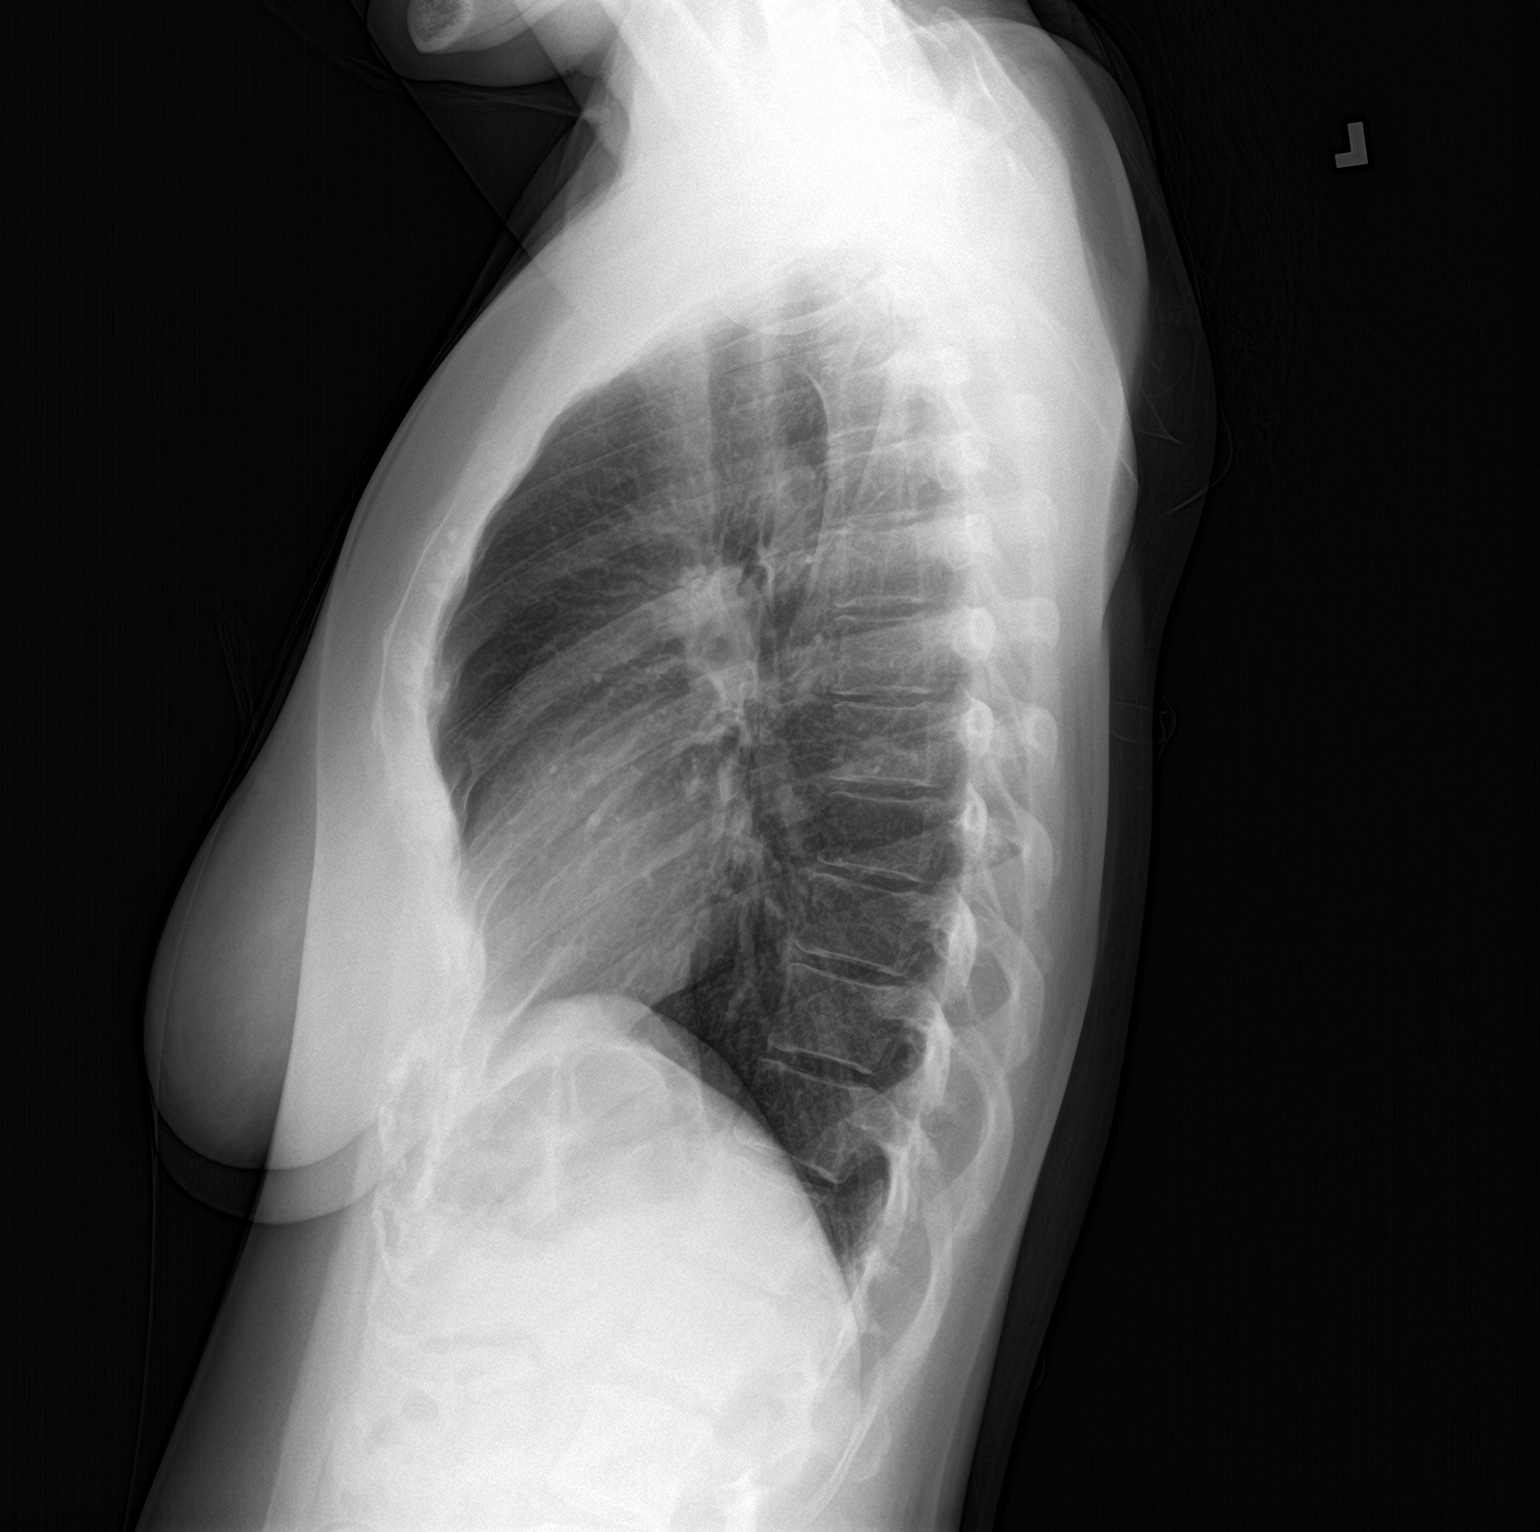

[2 of 2 positions shown; findings below may reference images not displayed]

FINDINGS: The heart size and mediastinal contours are within normal limits.
Both lungs are clear. The visualized skeletal structures are
unremarkable.
IMPRESSION: No active cardiopulmonary disease.
